# Patient Record
Sex: Male | Born: 1940 | Race: White | Hispanic: No | Smoking: Former smoker
Health system: Southern US, Community
[De-identification: ages and names within clinical notes are randomized; demographics above are authoritative.]

## PROBLEM LIST (undated history)

## (undated) DIAGNOSIS — E119 Type 2 diabetes mellitus without complications: Secondary | ICD-10-CM

## (undated) DIAGNOSIS — I10 Essential (primary) hypertension: Secondary | ICD-10-CM

## (undated) DIAGNOSIS — L570 Actinic keratosis: Secondary | ICD-10-CM

## (undated) HISTORY — PX: SKIN CANCER EXCISION: SHX779

## (undated) HISTORY — DX: Essential (primary) hypertension: I10

## (undated) HISTORY — DX: Actinic keratosis: L57.0

## (undated) HISTORY — DX: Type 2 diabetes mellitus without complications: E11.9

## (undated) HISTORY — PX: PACEMAKER PLACEMENT: SHX43

---

## 2005-04-02 ENCOUNTER — Ambulatory Visit: Payer: Self-pay | Admitting: Family Medicine

## 2005-04-08 ENCOUNTER — Ambulatory Visit: Payer: Self-pay | Admitting: Family Medicine

## 2008-03-10 ENCOUNTER — Emergency Department: Payer: Self-pay | Admitting: Emergency Medicine

## 2009-12-21 ENCOUNTER — Ambulatory Visit: Payer: Self-pay | Admitting: Orthopedic Surgery

## 2010-07-10 ENCOUNTER — Ambulatory Visit: Payer: Self-pay | Admitting: Cardiology

## 2010-07-17 ENCOUNTER — Ambulatory Visit: Payer: Self-pay | Admitting: Cardiology

## 2011-11-03 ENCOUNTER — Ambulatory Visit: Payer: Self-pay | Admitting: Unknown Physician Specialty

## 2011-11-05 LAB — PATHOLOGY REPORT

## 2012-02-16 ENCOUNTER — Ambulatory Visit: Payer: Self-pay | Admitting: Orthopedic Surgery

## 2012-09-29 ENCOUNTER — Ambulatory Visit: Payer: Self-pay | Admitting: Urology

## 2012-09-29 LAB — BASIC METABOLIC PANEL
Anion Gap: 4 — ABNORMAL LOW (ref 7–16)
BUN: 12 mg/dL (ref 7–18)
Calcium, Total: 9.5 mg/dL (ref 8.5–10.1)
Chloride: 105 mmol/L (ref 98–107)
Creatinine: 0.65 mg/dL (ref 0.60–1.30)
EGFR (African American): >60
Glucose: 118 mg/dL — ABNORMAL HIGH (ref 65–99)
Osmolality: 273 (ref 275–301)
Sodium: 136 mmol/L (ref 136–145)

## 2012-10-04 ENCOUNTER — Ambulatory Visit: Payer: Self-pay | Admitting: Urology

## 2013-01-06 ENCOUNTER — Encounter: Payer: Self-pay | Admitting: Podiatry

## 2013-01-07 ENCOUNTER — Encounter: Payer: Self-pay | Admitting: Podiatry

## 2013-01-07 ENCOUNTER — Ambulatory Visit (INDEPENDENT_AMBULATORY_CARE_PROVIDER_SITE_OTHER): Payer: Medicare Other | Admitting: Podiatry

## 2013-01-07 VITALS — BP 126/87 | HR 83 | Resp 16

## 2013-01-07 DIAGNOSIS — B351 Tinea unguium: Secondary | ICD-10-CM

## 2013-01-07 DIAGNOSIS — L259 Unspecified contact dermatitis, unspecified cause: Secondary | ICD-10-CM

## 2013-01-07 DIAGNOSIS — M79609 Pain in unspecified limb: Secondary | ICD-10-CM

## 2013-01-07 NOTE — Progress Notes (Signed)
Subjective:     Patient ID: LEWIN PELLOW, male   DOB: 1940/12/18, 72 y.o.   MRN: [id number]  HPI patient presents with nail disease of both feet with thickness and pain in the corners and also dry skin in the back of the feet that he wants to discuss and consider further treatment for   Review of Systems     Objective:   Physical Exam Neurovascular status unchanged with no change in health history. Nail disease with thickness 1-5 both feet that has improved with oral and topical treatment. 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 We discussed the heel condition and will continue utilizing Vaseline under occlusion and nystatin which was prescribed today

## 2013-01-07 NOTE — Progress Notes (Signed)
   Subjective:    Patient ID: Carl Chen, male    DOB: [birth year], 72 y.o.   MRN: [id number]  HPI Comments: i really dont know i guess he is going to look at them , cut toenails      Review of Systems     Objective:   Physical Exam        Assessment & Plan:

## 2013-04-08 ENCOUNTER — Ambulatory Visit: Payer: Medicare Other | Admitting: Podiatry

## 2013-05-06 ENCOUNTER — Ambulatory Visit: Payer: Self-pay | Admitting: Endocrinology

## 2013-07-01 DIAGNOSIS — E782 Mixed hyperlipidemia: Secondary | ICD-10-CM | POA: Insufficient documentation

## 2014-03-06 ENCOUNTER — Ambulatory Visit: Payer: Self-pay | Admitting: Unknown Physician Specialty

## 2014-03-13 DIAGNOSIS — R079 Chest pain, unspecified: Secondary | ICD-10-CM | POA: Insufficient documentation

## 2014-03-13 DIAGNOSIS — I5022 Chronic systolic (congestive) heart failure: Secondary | ICD-10-CM | POA: Insufficient documentation

## 2014-04-13 ENCOUNTER — Telehealth: Payer: Self-pay | Admitting: *Deleted

## 2014-04-13 MED ORDER — NYSTATIN-TRIAMCINOLONE 100000-0.1 UNIT/GM-% EX OINT: 1.0000 "application " | TOPICAL_OINTMENT | Freq: Two times a day (BID) | CUTANEOUS | Status: DC

## 2014-04-13 NOTE — Telephone Encounter (Signed)
Refill request for nystatin from [facility]. Ok to fill

## 2014-05-12 NOTE — H&P (Signed)
PATIENT NAME:  Carl Chen, Carl Chen MR#:  [id number] DATE OF BIRTH:  03/29/1940  DATE OF ADMISSION:  10/04/2012  CHIEF COMPLAINT: Difficulty urinating.   HISTORY OF PRESENT ILLNESS:  Mr. Droke is a 74 year old white male with a 3 to 4 year history of difficulty voiding. He also was found to have an elevated PSA of 7.85. Evaluation in the office included ultrasound-guided biopsy which revealed a 72 gram prostate with benign histology. Uroflow study was consistent with significant obstruction and a postvoid residual of 135 mL. 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 The patient also had a posterior bladder wall diverticulum and a heavily trabeculated bladder. The patient comes in now for Photovaporization of the prostate with green light laser.   ALLERGIES: PENICILLIN.   CURRENT MEDICATIONS: Included Flomax, metformin, glipizide, amlodipine, losartan, simvastatin and Tylenol.   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   SOCIAL HISTORY: The patient chews tobacco. He denied alcohol use.   FAMILY HISTORY: Remarkable for hypertension.   PAST AND CURRENT MEDICAL CONDITIONS:  1.  Diabetes.  2.  Hypertension.  3.  Hyperlipidemia.  4.  Status post pacemaker placement for uncertain reason.  5.  Chronic shoulder and back pain.   REVIEW OF SYSTEMS: The patient denied stroke, shortness of breath, chest pain or myocardial infarction.   PHYSICAL EXAMINATION: GENERAL: Thin, white male in no distress.  HEENT: Sclerae were clear.  NECK: Supple. No palpable cervical adenopathy.  LUNGS: Clear to auscultation.  CARDIOVASCULAR: Regular rhythm and rate without audible murmurs.  ABDOMEN: Soft, nontender abdomen.  GENITOURINARY: Uncircumcised testes smooth and nontender.  RECTAL: Greater than 50 grams smooth nontender prostate.  NEUROMUSCULAR: Alert and  oriented x3.   IMPRESSION:  1.  Benign prostatic hypertrophy with bladder outlet obstruction.  2.  Elevated PSA.   PLAN: Photovaporization of the prostate with green light laser.    ____________________________ Otelia Limes. Yves Dill, MD [email or web address]:dp D: 09/29/2012 13:11:00 ET T: 09/29/2012 13:33:54 ET JOB#: [id number]  cc: Otelia Limes. Yves Dill, MD, <Dictator> Royston Cowper MD ELECTRONICALLY SIGNED 09/29/2012 19:27

## 2014-05-12 NOTE — Op Note (Signed)
PATIENT NAME:  Carl Chen, Carl Chen MR#:  [id number] DATE OF BIRTH:  Apr 04, 1940  DATE OF PROCEDURE:  10/04/2012  PREOPERATIVE DIAGNOSIS: Benign prostatic hypertrophy with bladder outlet obstruction.   POSTOPERATIVE DIAGNOSIS: Benign prostatic hypertrophy with bladder outlet obstruction.   PROCEDURE PERFORMED: Photovaporization of the prostate with the GreenLight laser.   SURGEON: Maryan Puls, M.D.   ANESTHETIST: Rice  ANESTHESIA METHOD: General.   INDICATIONS: See the dictated history and physical. After informed consent, the patient requests the above procedures.   OPERATIVE SUMMARY: After adequate general anesthesia had been obtained, the patient was placed into dorsal lithotomy position and the perineum was prepped and draped in the usual fashion. The laser scope was coupled with the camera and then visually advanced into the bladder. The bladder was heavily trabeculated with cellules and multiple small diverticula present. The patient was noted to have trilobar BPH with intravesical growth of a large median lobe. At this point the GreenLight XPS laser fiber was introduced through the scope and initial power setting placed at 80 watts. Median lobe and bladder neck tissue was vaporized. Power was then increased to 120 watts and obstructive tissue from the bladder neck to the verumontanum was vaporized. Finally, the power was increased to 180 watts and remaining obstructive tissue vaporized. At this point the scope was removed. A 20 French silicone catheter was placed. The catheter was irrigated until clear. B and O suppositories was placed. The procedure was then terminated and the patient was transferred to the recovery room in stable condition.      ____________________________ Otelia Limes. Yves Dill, MD [email or web address]:sg D: 10/04/2012 09:54:00 ET T: 10/04/2012 10:59:27 ET JOB#: [id number]  cc: Otelia Limes. Yves Dill, MD, <Dictator> Royston Cowper MD ELECTRONICALLY SIGNED 10/07/2012 10:12

## 2014-07-04 DIAGNOSIS — I10 Essential (primary) hypertension: Secondary | ICD-10-CM | POA: Insufficient documentation

## 2014-07-06 ENCOUNTER — Other Ambulatory Visit: Payer: Self-pay | Admitting: Unknown Physician Specialty

## 2014-07-06 DIAGNOSIS — M75102 Unspecified rotator cuff tear or rupture of left shoulder, not specified as traumatic: Secondary | ICD-10-CM

## 2014-07-11 ENCOUNTER — Ambulatory Visit
Admission: RE | Admit: 2014-07-11 | Discharge: 2014-07-11 | Disposition: A | Discharge status: Home / Self Care | Payer: Medicare Other | Source: Ambulatory Visit | Attending: Unknown Physician Specialty | Admitting: Unknown Physician Specialty

## 2014-07-11 DIAGNOSIS — M75102 Unspecified rotator cuff tear or rupture of left shoulder, not specified as traumatic: Secondary | ICD-10-CM

## 2014-07-11 DIAGNOSIS — M25512 Pain in left shoulder: Secondary | ICD-10-CM | POA: Insufficient documentation

## 2014-07-11 MED ORDER — IOHEXOL 300 MG/ML  SOLN: 10.0000 mL | Freq: Once | INTRAMUSCULAR | Status: AC | PRN

## 2015-07-26 ENCOUNTER — Telehealth: Payer: Self-pay | Admitting: *Deleted

## 2015-07-26 NOTE — Telephone Encounter (Signed)
[facility] sent a request for a refill on Nystatin 100000/gm topical cream, apply to affected areas twice daily.  Pharmacist stated although it has been a while, he had some left and it has helped his foot.  Response was sent that prescription was denied.  Patient needs to make an appointment to be evaluated.  He has not been seen since December 2014.

## 2015-12-28 ENCOUNTER — Other Ambulatory Visit: Payer: Self-pay | Admitting: Adult Health

## 2016-01-09 ENCOUNTER — Other Ambulatory Visit: Payer: Self-pay | Admitting: Adult Health

## 2016-01-09 DIAGNOSIS — M7989 Other specified soft tissue disorders: Secondary | ICD-10-CM

## 2016-02-08 ENCOUNTER — Encounter (INDEPENDENT_AMBULATORY_CARE_PROVIDER_SITE_OTHER): Payer: Self-pay

## 2016-02-08 ENCOUNTER — Other Ambulatory Visit (INDEPENDENT_AMBULATORY_CARE_PROVIDER_SITE_OTHER): Payer: Medicare Other

## 2016-02-08 ENCOUNTER — Other Ambulatory Visit: Payer: Self-pay | Admitting: Adult Health

## 2016-02-08 DIAGNOSIS — M7989 Other specified soft tissue disorders: Secondary | ICD-10-CM

## 2016-02-21 ENCOUNTER — Other Ambulatory Visit (INDEPENDENT_AMBULATORY_CARE_PROVIDER_SITE_OTHER): Payer: Medicare Other

## 2016-09-21 IMAGING — CT CT SHOULDER*L* W/CM
2 of 4 series · 11 of 28 positions shown, 14 images · non-contrast
Comparison: 07/11/2014

CLINICAL DATA: Left shoulder pain

EXAM:
CT ARTHROGRAPHY OF THE left shoulder
TECHNIQUE: Multidetector CT imaging was performed following the standard
protocol after injection of dilute contrast into the joint.

[Series 3: mpr · axial · 0.33mm/px · z∈[-296,-216]mm · 6 of 141 slices shown, 8 images]
[im 21/141  soft-tissue]
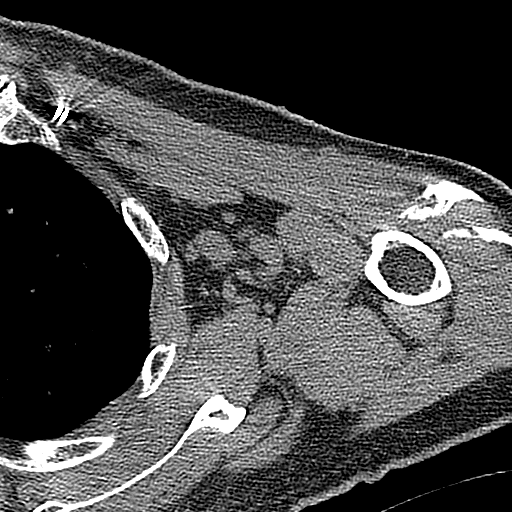
[im 21/141  bone]
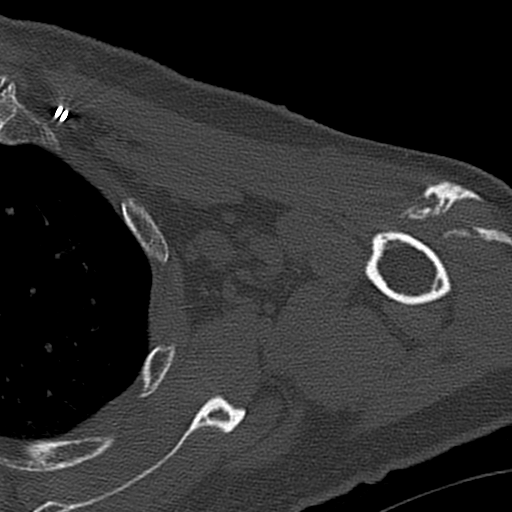
[im 41/141  bone]
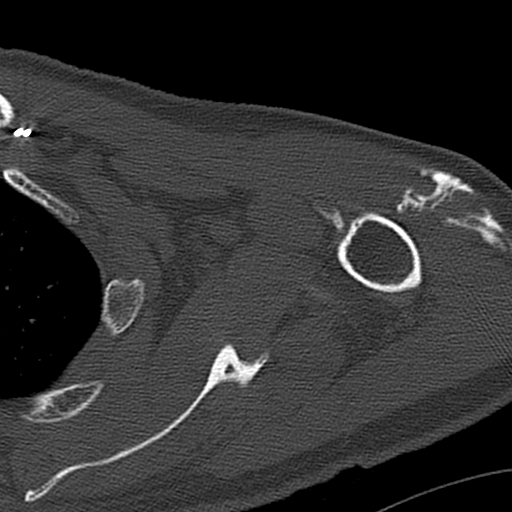
[im 61/141  bone]
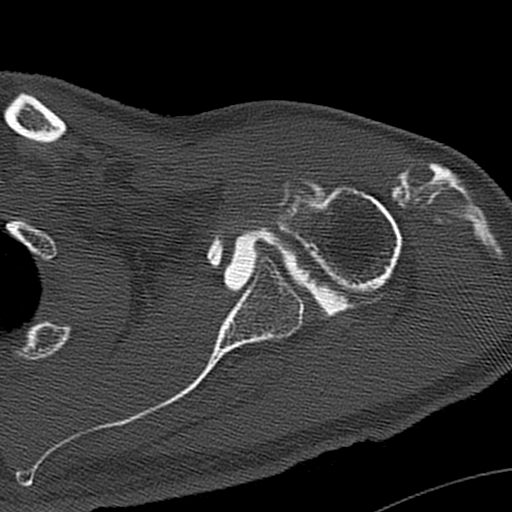
[im 81/141  bone]
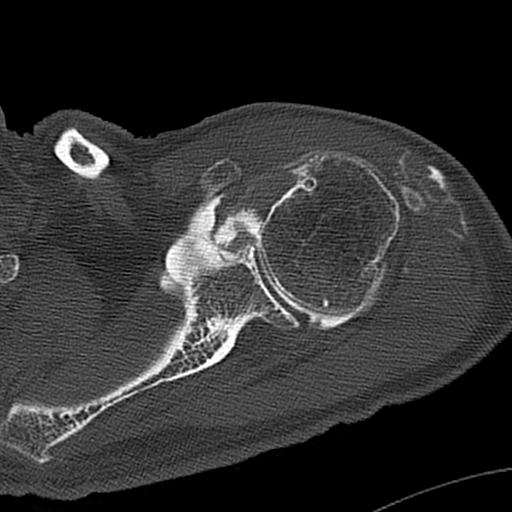
[im 101/141  soft-tissue]
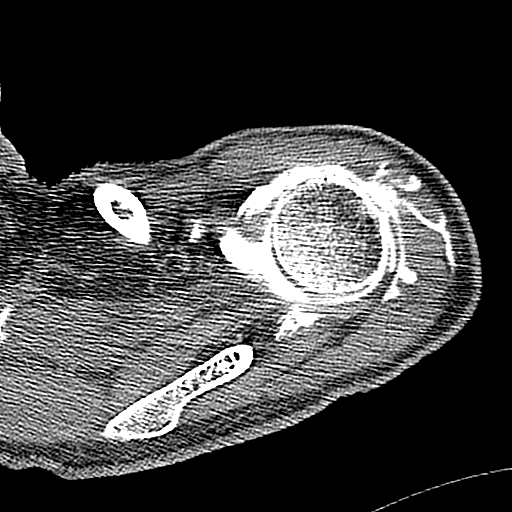
[im 101/141  bone]
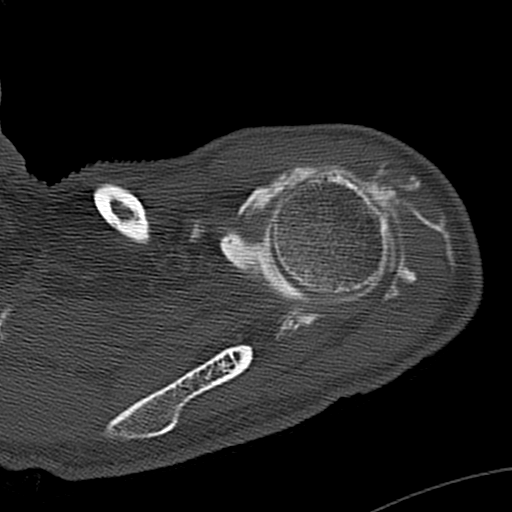
[im 121/141  bone]
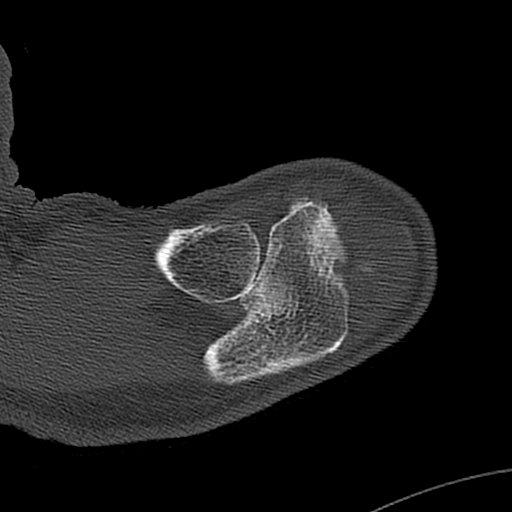

[Series 603: sag bone · sagittal · 0.33mm/px · 5 of 56 slices shown, 6 images]
[im 19/56  bone]
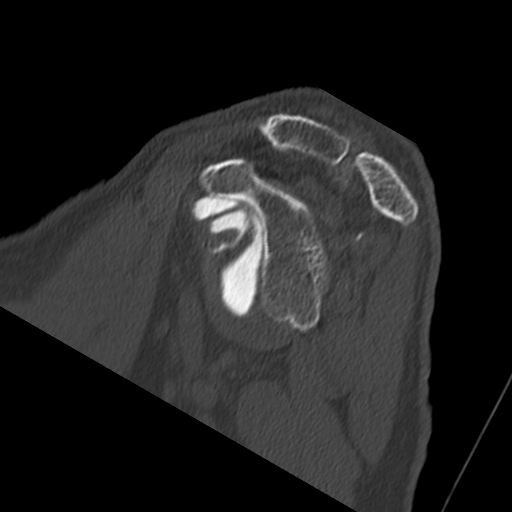
[im 23/56  bone]
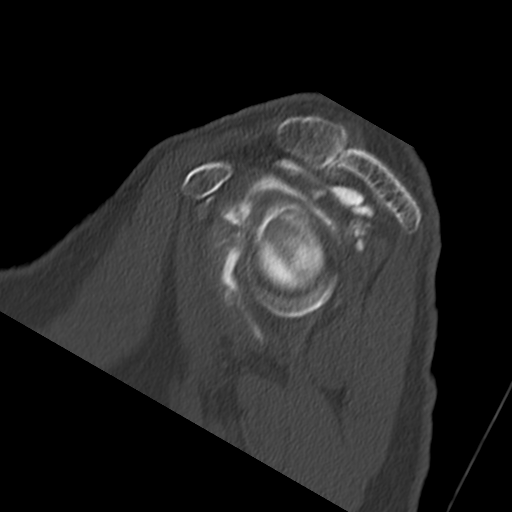
[im 28/56  soft-tissue]
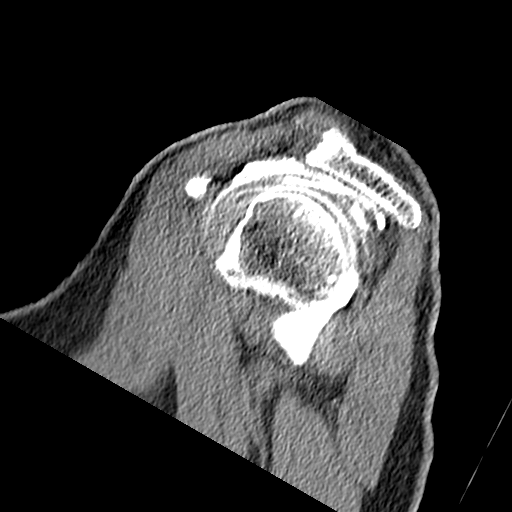
[im 28/56  bone]
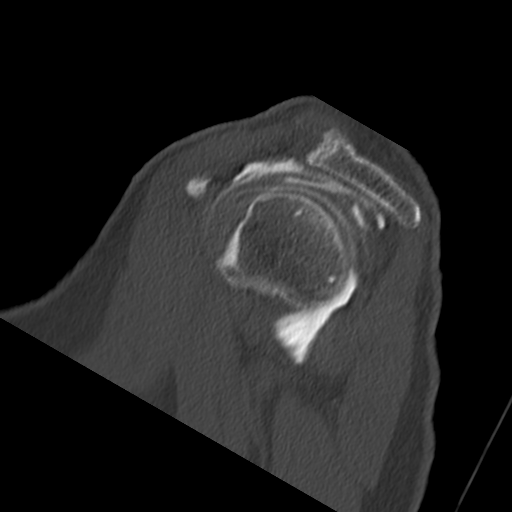
[im 33/56  bone]
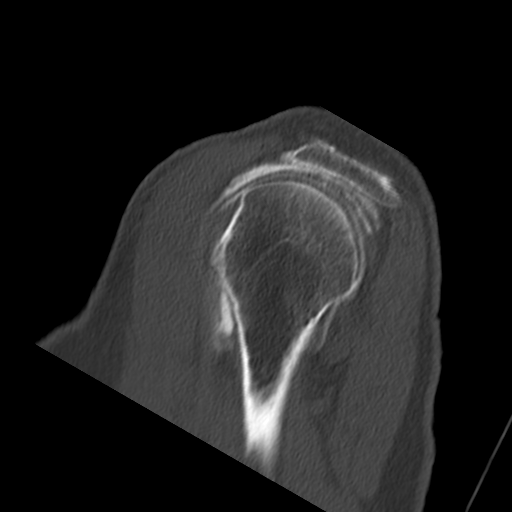
[im 37/56  bone]
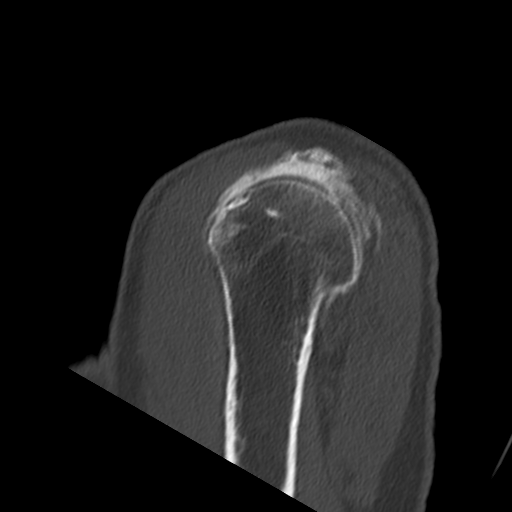

[11 of 28 positions shown; findings below may reference images not displayed]

FINDINGS: Rotator cuff: Full-thickness full width rupture of the
supraspinatus, with the pointed in irregular and of the
supraspinatus tendon retracted about 2.8 cm. There is some
delamination in the supraspinatus tendon as well, with the articular
side of the supraspinatus further retracted and the bursal side.
Currently there is only minimal fatty atrophy in the supraspinatus
tendon.

Full-thickness partial width tearing of the infraspinatus tendon
especially superiorly, with intrasubstance fissuring containing
contrast medium, and prominent thinning of the infraspinatus tendon.
Mild infraspinatus muscular atrophy.

Full-thickness partial width tearing of portions of the
subscapularis although without significant muscular atrophy.

No tear involving the articular surface of the teres minor observed.

Mild longitudinal tearing in the long head of the biceps is
appreciable in the bicipital groove.

Moderate degenerative AC joint arthropathy noted with inferior
spurring and type 2 subacromial morphology.

There is moderate chondral thinning along the humeral head with
relative preservation of the articular cartilage of the glenoid.

Contrast medium tracks into the deltoid muscle laterally. This is
unusual and was not at the site of injection, and raises the
possibility of a lateral deltoid muscle tear.

There is relatively nondisplaced SLAP tear shown on images 29-23 of
series 602.
IMPRESSION: 1. Full-thickness full width rupture of the supraspinatus, variable
amount of retraction but with the least retracted portion pulled
back 2.8 cm. Tendon delamination.
2. Full-thickness partial width tearing of the infraspinatus and
subscapularis tendons.
3. Mild atrophy in the supraspinatus and infraspinatus muscles.
4. Contrast tracks distally in the lateral deltoid muscle, not at
the site of injection, suspicious for lateral deltoid muscular tear.
5. Mild longitudinal tearing in the long head of the biceps in the
bicipital groove.
6. Nondisplaced SLAP tear.
7. Moderate degenerative AC joint arthropathy. Moderate chondral
thinning along the humeral head.

## 2016-09-21 IMAGING — RF DG ARTHROGRAM SHOULDER*L*
4 series · 4 of 4 positions shown · IV contrast (omnipaque)
Comparison: None.

CLINICAL DATA: Left shoulder pain. Nontraumatic tear left rotator
cuff

EXAM:
ARTHROGRAM OF THE LEFT SHOULDER
FLUOROSCOPY TIME:  Radiation Exposure Index (as provided by the
fluoroscopic device):
If the device does not provide the exposure index:
Fluoroscopy Time (in minutes and seconds):  1 minutes 10 seconds
Number of Acquired Images:
TECHNIQUE: I obtained informed written consent from the patient including
possible complications of infection, bleeding, and allergic
reaction. The patient was not aware that an injection would be
performed today however agreed to the procedure after discussion.
The left shoulder was prepped with chlorhexidine. 2% lidocaine was
used for local anesthesia. 22 gauge spinal needle was placed into
the shoulder joint using fluoroscopic guidance. 10 mL of a mixture
of 5 mL of Omnipaque 300 IV and 5 mL of 1% lidocaine was injected
into the shoulder joint.

[Series 1: fluoro_arthrogram_singleshot_bw · 0.19mm/px · 1 of 1 slices shown (1 of 3)]
[im 1/1]
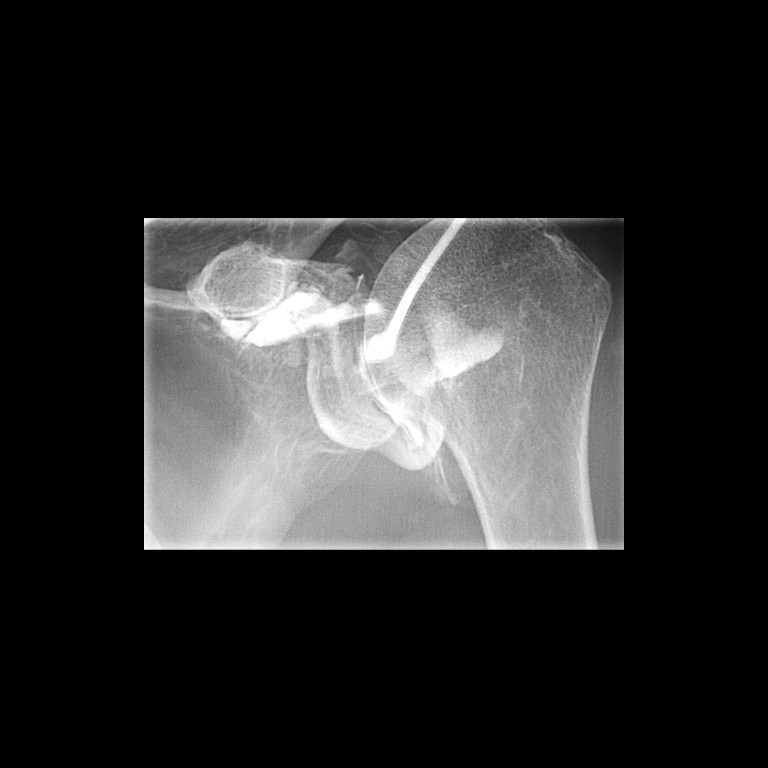

[Series 2: fluoro_arthrogram_singleshot_bw · 0.19mm/px · 1 of 1 slices shown (2 of 3)]
[im 1/1]
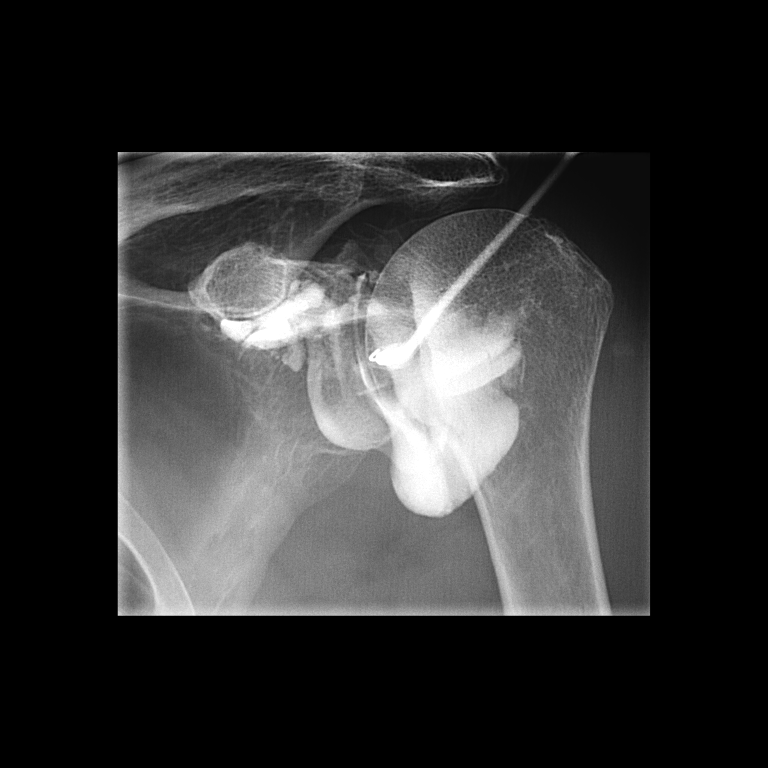

[Series 3: fluoro_arthrogram_singleshot_bw · 0.19mm/px · 1 of 1 slices shown (3 of 3)]
[im 1/1]
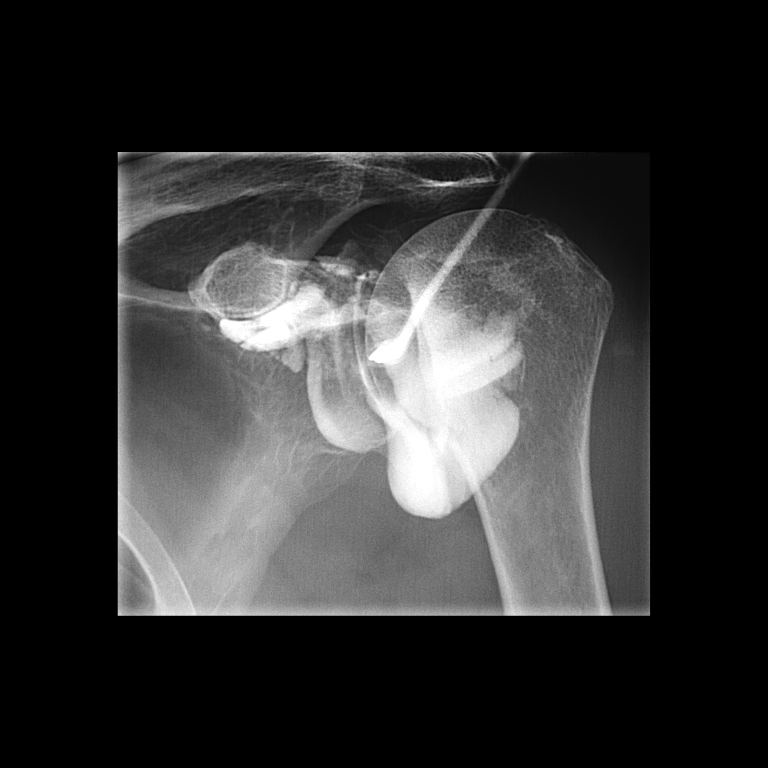

[Series 4: cp_standard · 0.19mm/px · 1 of 1 slices shown]
[im 1/1]
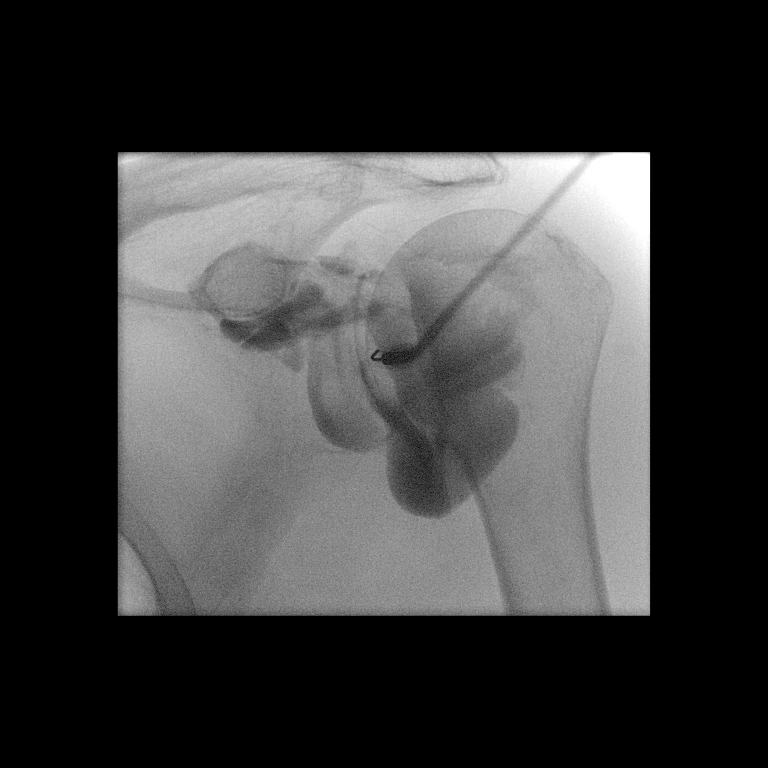

[4 of 4 positions shown; findings below may reference images not displayed]

FINDINGS: Satisfactory left shoulder injection. No rotator cuff tear
identified. Shoulder joint appears normal.
IMPRESSION: Successful left shoulder injection for CT evaluation.

## 2017-05-01 DIAGNOSIS — I420 Dilated cardiomyopathy: Secondary | ICD-10-CM | POA: Insufficient documentation

## 2017-05-19 DIAGNOSIS — Z95810 Presence of automatic (implantable) cardiac defibrillator: Secondary | ICD-10-CM | POA: Insufficient documentation

## 2017-12-14 ENCOUNTER — Encounter: Payer: Medicare Other | Attending: Cardiology | Admitting: *Deleted

## 2017-12-14 ENCOUNTER — Encounter: Payer: Self-pay | Admitting: *Deleted

## 2017-12-14 VITALS — Ht 71.1 in | Wt 158.1 lb

## 2017-12-14 DIAGNOSIS — I5022 Chronic systolic (congestive) heart failure: Secondary | ICD-10-CM | POA: Diagnosis present

## 2017-12-14 DIAGNOSIS — Z720 Tobacco use: Secondary | ICD-10-CM | POA: Diagnosis not present

## 2017-12-14 DIAGNOSIS — I10 Essential (primary) hypertension: Secondary | ICD-10-CM | POA: Diagnosis not present

## 2017-12-14 DIAGNOSIS — E119 Type 2 diabetes mellitus without complications: Secondary | ICD-10-CM | POA: Insufficient documentation

## 2017-12-14 DIAGNOSIS — Z79899 Other long term (current) drug therapy: Secondary | ICD-10-CM | POA: Diagnosis not present

## 2017-12-14 DIAGNOSIS — Z794 Long term (current) use of insulin: Secondary | ICD-10-CM | POA: Insufficient documentation

## 2017-12-14 NOTE — Progress Notes (Signed)
Cardiac Individual Treatment Plan  Patient Details  Name: Carl Chen MRN: [id number] Date of Birth: September 23, 1940 Referring Provider:     Cardiac Rehab from 12/14/2017 in [facility] Cardiac and Pulmonary Rehab  Referring Provider  [name] MD      Initial Encounter Date:    Cardiac Rehab from 12/14/2017 in [facility] Cardiac and Pulmonary Rehab  Date  12/14/17      Visit Diagnosis: Heart failure, chronic systolic (HCC)  Patient's Home Medications on Admission:  Current Outpatient Medications:  .  amLODipine (NORVASC) 5 MG tablet, , Disp: , Rfl:  .  Cinnamon 500 MG capsule, Take by mouth., Disp: , Rfl:  .  ENTRESTO 24-26 MG, , Disp: , Rfl:  .  glipiZIDE (GLUCOTROL XL) 5 MG 24 hr tablet, Take 5 mg by mouth daily with breakfast., Disp: , Rfl:  .  Insulin Isophane & Regular Human (HUMULIN 70/30 KWIKPEN) (70-30) 100 UNIT/ML PEN, Inject into the skin., Disp: , Rfl:  .  Investigational - Study Medication, Take 35 mg by mouth daily. Take o each by mouth Daily before dinner 35 mg empaglifozin QQI/WL79 mg placebo. Study is being conducted by [employer] ,[place], Disp: , Rfl:  .  magnesium oxide (MAG-OX) 400 MG tablet, Take by mouth., Disp: , Rfl:  .  metFORMIN (GLUCOPHAGE) 1000 MG tablet, Take 1,000 mg by mouth 2 (two) times daily with a meal., Disp: , Rfl:  .  metoprolol succinate (TOPROL-XL) 25 MG 24 hr tablet, Take by mouth., Disp: , Rfl:  .  nystatin-triamcinolone ointment (MYCOLOG), Apply 1 application topically 2 (two) times daily., Disp: 30 g, Rfl: 0 .  simvastatin (ZOCOR) 20 MG tablet, Take 20 mg by mouth daily., Disp: , Rfl:  .  Urea (URAMAXIN EX), Apply topically., Disp: , Rfl:  .  vitamin B-12 (CYANOCOBALAMIN) 500 MCG tablet, Take by mouth., Disp: , Rfl:  .  amLODipine-olmesartan (AZOR) 5-20 MG per tablet, Take 1 tablet by mouth daily., Disp: , Rfl:  .  losartan (COZAAR) 50 MG tablet, Take 50 mg by mouth daily., Disp: , Rfl:   Past Medical History: Past Medical History:   Diagnosis Date  . Diabetes ([place])   . Hypertension     Tobacco Use: Social History   Tobacco Use  Smoking Status Current Every Day Smoker  . Packs/day: 3.00  . Years: 60.00  . Pack years: 180.00  Smokeless Tobacco Current User  . Types: Chew  Tobacco Comment   Not ready to Quit tobacco pouch use    Labs: Recent Review Flowsheet Data    There is no flowsheet data to display.       Exercise Target Goals: Exercise Program Goal: Individual exercise prescription set using results from initial 6 min walk test and THRR while considering  patient's activity barriers and safety.   Exercise Prescription Goal: Initial exercise prescription builds to 30-45 minutes a day of aerobic activity, 2-3 days per week.  Home exercise guidelines will be given to patient during program as part of exercise prescription that the participant will acknowledge.  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 Has had cortisone shots in the past   Cardiac Risk Stratification  High       6 Minute Walk: 6 Minute Walk    Row Name 12/14/17  1411         6 Minute Walk   Phase  Initial     Distance  1360 feet     Walk Time  6 minutes     # of Rest Breaks  0     MPH  2.58     METS  3.05     RPE  9     VO2 Peak  10.67     Symptoms  No     Resting HR  76 bpm     Resting BP  126/74     Resting Oxygen Saturation   97 %     Exercise Oxygen Saturation  during 6 min walk  96 %     Max Ex. HR  80 bpm     Max Ex. BP  146/70     2 Minute Post BP  126/62        Oxygen Initial Assessment:   Oxygen Re-Evaluation:   Oxygen Discharge (Final Oxygen Re-Evaluation):   Initial Exercise Prescription: Initial Exercise Prescription - 12/14/17 1400      Date of  Initial Exercise RX and Referring Provider   Date  12/14/17    Referring Provider  [name] MD      Treadmill   MPH  2.5    Grade  0.5    Minutes  15    METs  3.09      NuStep   Level  3    SPM  80    Minutes  15    METs  3      REL-XR   Level  2    Speed  50    Minutes  15    METs  3      Prescription Details   Frequency (times per week)  2    Duration  Progress to 30 minutes of continuous aerobic without signs/symptoms of physical distress      Intensity   THRR 40-80% of Max Heartrate  104-131    Ratings of Perceived Exertion  11-13    Perceived Dyspnea  0-4      Progression   Progression  Continue to progress workloads to maintain intensity without signs/symptoms of physical distress.      Resistance Training   Training Prescription  Yes    Weight  4 lbs    Reps  10-15       Perform Capillary Blood Glucose checks as needed.  Exercise Prescription Changes: Exercise Prescription Changes    Row Name 12/14/17 1300             Response to Exercise   Blood Pressure (Admit)  126/74       Blood Pressure (Exercise)  146/70       Blood Pressure (Exit)  126/62       Heart Rate (Admit)  76 bpm       Heart Rate (Exercise)  80 bpm       Heart Rate (Exit)  75 bpm       Oxygen Saturation (Admit)  97 %       Oxygen Saturation (Exercise)  96 %       Rating of Perceived Exertion (Exercise)  9       Symptoms  none       Comments  walk test results          Exercise Comments:   Exercise Goals and Review: Exercise Goals    Row Name 12/14/17 1414  Exercise Goals   Increase Physical Activity  Yes       Intervention  Provide advice, education, support and counseling about physical activity/exercise needs.;Develop an individualized exercise prescription for aerobic and resistive training based on initial evaluation findings, risk stratification, comorbidities and participant's personal goals.       Expected Outcomes  Short Term: Attend rehab on  a regular basis to increase amount of physical activity.;Long Term: Exercising regularly at least 3-5 days a week.;Long Term: Add in home exercise to make exercise part of routine and to increase amount of physical activity.       Increase Strength and Stamina  Yes       Intervention  Provide advice, education, support and counseling about physical activity/exercise needs.;Develop an individualized exercise prescription for aerobic and resistive training based on initial evaluation findings, risk stratification, comorbidities and participant's personal goals.       Expected Outcomes  Short Term: Increase workloads from initial exercise prescription for resistance, speed, and METs.;Short Term: Perform resistance training exercises routinely during rehab and add in resistance training at home;Long Term: Improve cardiorespiratory fitness, muscular endurance and strength as measured by increased METs and functional capacity (6MWT)       Able to understand and use rate of perceived exertion (RPE) scale  Yes       Intervention  Provide education and explanation on how to use RPE scale       Expected Outcomes  Short Term: Able to use RPE daily in rehab to express subjective intensity level;Long Term:  Able to use RPE to guide intensity level when exercising independently       Knowledge and understanding of Target Heart Rate Range (THRR)  Yes       Intervention  Provide education and explanation of THRR including how the numbers were predicted and where they are located for reference       Expected Outcomes  Short Term: Able to state/look up THRR;Short Term: Able to use daily as guideline for intensity in rehab;Long Term: Able to use THRR to govern intensity when exercising independently       Able to check pulse independently  Yes       Intervention  Provide education and demonstration on how to check pulse in carotid and radial arteries.;Review the importance of being able to check your own pulse for safety  during independent exercise       Expected Outcomes  Short Term: Able to explain why pulse checking is important during independent exercise;Long Term: Able to check pulse independently and accurately       Understanding of Exercise Prescription  Yes       Intervention  Provide education, explanation, and written materials on patient's individual exercise prescription       Expected Outcomes  Short Term: Able to explain program exercise prescription;Long Term: Able to explain home exercise prescription to exercise independently          Exercise Goals Re-Evaluation :   Discharge Exercise Prescription (Final Exercise Prescription Changes): Exercise Prescription Changes - 12/14/17 1300      Response to Exercise   Blood Pressure (Admit)  126/74    Blood Pressure (Exercise)  146/70    Blood Pressure (Exit)  126/62    Heart Rate (Admit)  76 bpm    Heart Rate (Exercise)  80 bpm    Heart Rate (Exit)  75 bpm    Oxygen Saturation (Admit)  97 %    Oxygen Saturation (Exercise)  96 %    Rating of Perceived Exertion (Exercise)  9    Symptoms  none    Comments  walk test results       Nutrition:  Target Goals: Understanding of nutrition guidelines, daily intake of sodium <1575m, cholesterol <2028m calories 30% from fat and 7% or less from saturated fats, daily to have 5 or more servings of fruits and vegetables.  Biometrics: Pre Biometrics - 12/14/17 1415      Pre Biometrics   Height  5' 11.1" (1.806 m)    Weight  158 lb 1.6 oz (71.7 kg)    Waist Circumference  32 inches    Hip Circumference  38 inches    Waist to Hip Ratio  0.84 %    BMI (Calculated)  21.99    Single Leg Stand  15.3 seconds        Nutrition Therapy Plan and Nutrition Goals: Nutrition Therapy & Goals - 12/14/17 1355      Intervention Plan   Intervention  Prescribe, educate and counsel regarding individualized specific dietary modifications aiming towards targeted core components such as weight, hypertension,  lipid management, diabetes, heart failure and other comorbidities.    Expected Outcomes  Short Term Goal: Understand basic principles of dietary content, such as calories, fat, sodium, cholesterol and nutrients.;Short Term Goal: A plan has been developed with personal nutrition goals set during dietitian appointment.;Long Term Goal: Adherence to prescribed nutrition plan.       Nutrition Assessments: Nutrition Assessments - 12/14/17 1356      MEDFICTS Scores   Pre Score  6       Nutrition Goals Re-Evaluation:   Nutrition Goals Discharge (Final Nutrition Goals Re-Evaluation):   Psychosocial: Target Goals: Acknowledge presence or absence of significant depression and/or stress, maximize coping skills, provide positive support system. Participant is able to verbalize types and ability to use techniques and skills needed for reducing stress and depression.   Initial Review & Psychosocial Screening: Initial Psych Review & Screening - 12/14/17 1353      Initial Review   Current issues with  None Identified      Family Dynamics   Good Support System?  Yes   daughter-Michelle     Barriers   Psychosocial barriers to participate in program  There are no identifiable barriers or psychosocial needs.;The patient should benefit from training in stress management and relaxation.      Screening Interventions   Interventions  Encouraged to exercise;To provide support and resources with identified psychosocial needs;Provide feedback about the scores to participant    Expected Outcomes  Short Term goal: Utilizing psychosocial counselor, staff and physician to assist with identification of specific Stressors or current issues interfering with healing process. Setting desired goal for each stressor or current issue identified.;Long Term Goal: Stressors or current issues are controlled or eliminated.;Short Term goal: Identification and review with participant of any Quality of Life or Depression  concerns found by scoring the questionnaire.;Long Term goal: The participant improves quality of Life and PHQ9 Scores as seen by post scores and/or verbalization of changes       Quality of Life Scores:  Quality of Life - 12/14/17 1355      Quality of Life   Select  Quality of Life      Quality of Life Scores   Health/Function Pre  25.61 %    Socioeconomic Pre  23.57 %    Psych/Spiritual Pre  23.57 %    Family Pre  25.63 %  GLOBAL Pre  24.72 %      Scores of 19 and below usually indicate a poorer quality of life in these areas.  A difference of  2-3 points is a clinically meaningful difference.  A difference of 2-3 points in the total score of the Quality of Life Index has been associated with significant improvement in overall quality of life, self-image, physical symptoms, and general health in studies assessing change in quality of life.  PHQ-9: Recent Review Flowsheet Data    Depression screen [facility] 2/9 12/14/2017   Decreased Interest 0   Down, Depressed, Hopeless 0   PHQ - 2 Score 0   Altered sleeping 0   Tired, decreased energy 0   Change in appetite 0   Feeling bad or failure about yourself  0   Trouble concentrating 0   Moving slowly or fidgety/restless 0   Suicidal thoughts 0   PHQ-9 Score 0   Difficult doing work/chores Not difficult at all     Interpretation of Total Score  Total Score Depression Severity:  1-4 = Minimal depression, 5-9 = Mild depression, 10-14 = Moderate depression, 15-19 = Moderately severe depression, 20-27 = Severe depression   Psychosocial Evaluation and Intervention:   Psychosocial Re-Evaluation:   Psychosocial Discharge (Final Psychosocial Re-Evaluation):   Vocational Rehabilitation: Provide vocational rehab assistance to qualifying candidates.   Vocational Rehab Evaluation & Intervention: Vocational Rehab - 12/14/17 1357      Initial Vocational Rehab Evaluation & Intervention   Assessment shows need for Vocational  Rehabilitation  No       Education: Education Goals: Education classes will be provided on a variety of topics geared toward better understanding of heart health and risk factor modification. Participant will state understanding/return demonstration of topics presented as noted by education test scores.  Learning Barriers/Preferences: Learning Barriers/Preferences - 12/14/17 1356      Learning Barriers/Preferences   Learning Barriers  None    Learning Preferences  None       Education Topics:  AED/CPR: - Group verbal and written instruction with the use of models to demonstrate the basic use of the AED with the basic ABC's of resuscitation.   General Nutrition Guidelines/Fats and Fiber: -Group instruction provided by verbal, written material, models and posters to present the general guidelines for heart healthy nutrition. Gives an explanation and review of dietary fats and fiber.   Controlling Sodium/Reading Food Labels: -Group verbal and written material supporting the discussion of sodium use in heart healthy nutrition. Review and explanation with models, verbal and written materials for utilization of the food label.   Exercise Physiology & General Exercise Guidelines: - Group verbal and written instruction with models to review the exercise physiology of the cardiovascular system and associated critical values. Provides general exercise guidelines with specific guidelines to those with heart or lung disease.    Aerobic Exercise & Resistance Training: - Gives group verbal and written instruction on the various components of exercise. Focuses on aerobic and resistive training programs and the benefits of this training and how to safely progress through these programs..   Flexibility, Balance, Mind/Body Relaxation: Provides group verbal/written instruction on the benefits of flexibility and balance training, including mind/body exercise modes such as yoga, pilates and tai  chi.  Demonstration and skill practice provided.   Stress and Anxiety: - Provides group verbal and written instruction about the health risks of elevated stress and causes of high stress.  Discuss the correlation between heart/lung disease and anxiety and treatment  options. Review healthy ways to manage with stress and anxiety.   Depression: - Provides group verbal and written instruction on the correlation between heart/lung disease and depressed mood, treatment options, and the stigmas associated with seeking treatment.   Anatomy & Physiology of the Heart: - Group verbal and written instruction and models provide basic cardiac anatomy and physiology, with the coronary electrical and arterial systems. Review of Valvular disease and Heart Failure   Cardiac Procedures: - Group verbal and written instruction to review commonly prescribed medications for heart disease. Reviews the medication, class of the drug, and side effects. Includes the steps to properly store meds and maintain the prescription regimen. (beta blockers and nitrates)   Cardiac Medications I: - Group verbal and written instruction to review commonly prescribed medications for heart disease. Reviews the medication, class of the drug, and side effects. Includes the steps to properly store meds and maintain the prescription regimen.   Cardiac Medications II: -Group verbal and written instruction to review commonly prescribed medications for heart disease. Reviews the medication, class of the drug, and side effects. (all other drug classes)    Go Sex-Intimacy & Heart Disease, Get SMART - Goal Setting: - Group verbal and written instruction through game format to discuss heart disease and the return to sexual intimacy. Provides group verbal and written material to discuss and apply goal setting through the application of the S.M.A.R.T. Method.   Other Matters of the Heart: - Provides group verbal, written materials and  models to describe Stable Angina and Peripheral Artery. Includes description of the disease process and treatment options available to the cardiac patient.   Exercise & Equipment Safety: - Individual verbal instruction and demonstration of equipment use and safety with use of the equipment.   Cardiac Rehab from 12/14/2017 in [facility] Cardiac and Pulmonary Rehab  Date  12/14/17  Educator  [facility]  Instruction Review Code  1- Verbalizes Understanding      Infection Prevention: - Provides verbal and written material to individual with discussion of infection control including proper hand washing and proper equipment cleaning during exercise session.   Falls Prevention: - Provides verbal and written material to individual with discussion of falls prevention and safety.   Cardiac Rehab from 12/14/2017 in [facility] Cardiac and Pulmonary Rehab  Date  12/14/17  Educator  [facility]  Instruction Review Code  1- Verbalizes Understanding      Diabetes: - Individual verbal and written instruction to review signs/symptoms of diabetes, desired ranges of glucose level fasting, after meals and with exercise. Acknowledge that pre and post exercise glucose checks will be done for 3 sessions at entry of program.   Cardiac Rehab from 12/14/2017 in [facility] Cardiac and Pulmonary Rehab  Date  12/14/17  Educator  [facility]  Instruction Review Code  1- Verbalizes Understanding      Know Your Numbers and Risk Factors: -Group verbal and written instruction about important numbers in your health.  Discussion of what are risk factors and how they play a role in the disease process.  Review of Cholesterol, Blood Pressure, Diabetes, and BMI and the role they play in your overall health.   Sleep Hygiene: -Provides group verbal and written instruction about how sleep can affect your health.  Define sleep hygiene, discuss sleep cycles and impact of sleep habits. Review good sleep hygiene tips.    Other: -Provides group and verbal  instruction on various topics (see comments)   Knowledge Questionnaire Score: Knowledge Questionnaire Score - 12/14/17 1356  Knowledge Questionnaire Score   Pre Score  22/26   Reviewed correct responses with Carl Chen today. He verbalized understanding of the answers and had no further questions today.      Core Components/Risk Factors/Patient Goals at Admission: Personal Goals and Risk Factors at Admission - 12/14/17 1357      Core Components/Risk Factors/Patient Goals on Admission    Weight Management  Yes;Weight Gain    Intervention  Weight Management: Develop a combined nutrition and exercise program designed to reach desired caloric intake, while maintaining appropriate intake of nutrient and fiber, sodium and fats, and appropriate energy expenditure required for the weight goal.;Weight Management: Provide education and appropriate resources to help participant work on and attain dietary goals.    Admit Weight  158 lb 1.6 oz (71.7 kg)    Goal Weight: Short Term  160 lb (72.6 kg)    Goal Weight: Long Term  165 lb (74.8 kg)    Expected Outcomes  Short Term: Continue to assess and modify interventions until short term weight is achieved;Long Term: Adherence to nutrition and physical activity/exercise program aimed toward attainment of established weight goal;Weight Gain: Understanding of general recommendations for a high calorie, high protein meal plan that promotes weight gain by distributing calorie intake throughout the day with the consumption for 4-5 meals, snacks, and/or supplements   Carl Chen has lost about 20 lbs and his doctor has asked him to gain some weight.   Tobacco Cessation  Yes   Reviewed steps toward quitting with Carl Chen today. He does not state that he is ready to quit.    Number of packs per day  uses tobacco pouches about 3  a day    Intervention  Assist the participant in steps to quit. Provide individualized education and counseling about committing to Tobacco  Cessation, relapse prevention, and pharmacological support that can be provided by physician.;Advice worker, assist with locating and accessing local/national Quit Smoking programs, and support quit date choice.    Expected Outcomes  Short Term: Will demonstrate readiness to quit, by selecting a quit date.;Short Term: Will quit all tobacco product use, adhering to prevention of relapse plan.;Long Term: Complete abstinence from all tobacco products for at least 12 months from quit date.    Diabetes  Yes    Intervention  Provide education about signs/symptoms and action to take for hypo/hyperglycemia.;Provide education about proper nutrition, including hydration, and aerobic/resistive exercise prescription along with prescribed medications to achieve blood glucose in normal ranges: Fasting glucose 65-99 mg/dL    Expected Outcomes  Short Term: Participant verbalizes understanding of the signs/symptoms and immediate care of hyper/hypoglycemia, proper foot care and importance of medication, aerobic/resistive exercise and nutrition plan for blood glucose control.;Long Term: Attainment of HbA1C < 7%.    Heart Failure  Yes   Carl Chen does not have scales at home.  Will check to see if we can get him some scales.    Intervention  Provide a combined exercise and nutrition program that is supplemented with education, support and counseling about heart failure. Directed toward relieving symptoms such as shortness of breath, decreased exercise tolerance, and extremity edema.    Expected Outcomes  Improve functional capacity of life;Short term: Attendance in program 2-3 days a week with increased exercise capacity. Reported lower sodium intake. Reported increased fruit and vegetable intake. Reports medication compliance.;Short term: Daily weights obtained and reported for increase. Utilizing diuretic protocols set by physician.;Long term: Adoption of self-care skills and reduction of barriers for early signs  and symptoms recognition and intervention leading to self-care maintenance.    Lipids  Yes    Intervention  Provide education and support for participant on nutrition & aerobic/resistive exercise along with prescribed medications to achieve LDL <21m, HDL >42m    Expected Outcomes  Short Term: Participant states understanding of desired cholesterol values and is compliant with medications prescribed. Participant is following exercise prescription and nutrition guidelines.;Long Term: Cholesterol controlled with medications as prescribed, with individualized exercise RX and with personalized nutrition plan. Value goals: LDL < 7065mHDL > 40 mg.       Core Components/Risk Factors/Patient Goals Review:    Core Components/Risk Factors/Patient Goals at Discharge (Final Review):    ITP Comments: ITP Comments    Row Name 12/14/17 1337           ITP Comments  Medical evaluation completed today. ITP sent to Dr M MLoleta Chancer review,changes as needed and signature. [facility]          Comments: Initial ITP

## 2017-12-14 NOTE — Patient Instructions (Signed)
Patient Instructions  Patient Details  Name: Carl Chen MRN: [id number] Date of Birth: [birth year] Referring Provider:  Tylene Fantasia, MD  Below are your personal goals for exercise, nutrition, and risk factors. Our goal is to help you stay on track towards obtaining and maintaining these goals. We will be discussing your progress on these goals with you throughout the program.  Initial Exercise Prescription: Initial Exercise Prescription - 12/14/17 1400      Date of Initial Exercise RX and Referring Provider   Date  12/14/17    Referring Provider  [name] MD      Treadmill   MPH  2.5    Grade  0.5    Minutes  15    METs  3.09      NuStep   Level  3    SPM  80    Minutes  15    METs  3      REL-XR   Level  2    Speed  50    Minutes  15    METs  3      Prescription Details   Frequency (times per week)  2    Duration  Progress to 30 minutes of continuous aerobic without signs/symptoms of physical distress      Intensity   THRR 40-80% of Max Heartrate  104-131    Ratings of Perceived Exertion  11-13    Perceived Dyspnea  0-4      Progression   Progression  Continue to progress workloads to maintain intensity without signs/symptoms of physical distress.      Resistance Training   Training Prescription  Yes    Weight  4 lbs    Reps  10-15       Exercise Goals: Frequency: Be able to perform aerobic exercise two to three times per week in program working toward 2-5 days per week of home exercise.  Intensity: Work with a perceived exertion of 11 (fairly light) - 15 (hard) while following your exercise prescription.  We will make changes to your prescription with you as you progress through the program.   Duration: Be able to do 30 to 45 minutes of continuous aerobic exercise in addition to a 5 minute warm-up and a 5 minute cool-down routine.   Nutrition Goals: Your personal nutrition goals will be established when you do your nutrition analysis with  the dietician.  The following are general nutrition guidelines to follow: Cholesterol < 200mg /day Sodium < 1500mg /day Fiber: Men over 50 yrs - 30 grams per day  Personal Goals: Personal Goals and Risk Factors at Admission - 12/14/17 1357      Core Components/Risk Factors/Patient Goals on Admission    Weight Management  Yes;Weight Gain    Intervention  Weight Management: Develop a combined nutrition and exercise program designed to reach desired caloric intake, while maintaining appropriate intake of nutrient and fiber, sodium and fats, and appropriate energy expenditure required for the weight goal.;Weight Management: Provide education and appropriate resources to help participant work on and attain dietary goals.    Admit Weight  158 lb 1.6 oz (71.7 kg)    Goal Weight: Short Term  160 lb (72.6 kg)    Goal Weight: Long Term  165 lb (74.8 kg)    Expected Outcomes  Short Term: Continue to assess and modify interventions until short term weight is achieved;Long Term: Adherence to nutrition and physical activity/exercise program aimed toward attainment of established weight goal;Weight Gain: Understanding  of general recommendations for a high calorie, high protein meal plan that promotes weight gain by distributing calorie intake throughout the day with the consumption for 4-5 meals, snacks, and/or supplements   [name] has lost about 20 lbs and his doctor has asked him to gain some weight.   Tobacco Cessation  Yes   Reviewed steps toward quitting with [name] today. He does not state that he is ready to quit.    Number of packs per day  uses tobacco pouches about 3  a day    Intervention  Assist the participant in steps to quit. Provide individualized education and counseling about committing to Tobacco Cessation, relapse prevention, and pharmacological support that can be provided by physician.;Advice worker, assist with locating and accessing local/national Quit Smoking programs, and  support quit date choice.    Expected Outcomes  Short Term: Will demonstrate readiness to quit, by selecting a quit date.;Short Term: Will quit all tobacco product use, adhering to prevention of relapse plan.;Long Term: Complete abstinence from all tobacco products for at least 12 months from quit date.    Diabetes  Yes    Intervention  Provide education about signs/symptoms and action to take for hypo/hyperglycemia.;Provide education about proper nutrition, including hydration, and aerobic/resistive exercise prescription along with prescribed medications to achieve blood glucose in normal ranges: Fasting glucose 65-99 mg/dL    Expected Outcomes  Short Term: Participant verbalizes understanding of the signs/symptoms and immediate care of hyper/hypoglycemia, proper foot care and importance of medication, aerobic/resistive exercise and nutrition plan for blood glucose control.;Long Term: Attainment of HbA1C < 7%.    Heart Failure  Yes   [name] does not have scales at home.  Will check to see if we can get him some scales.    Intervention  Provide a combined exercise and nutrition program that is supplemented with education, support and counseling about heart failure. Directed toward relieving symptoms such as shortness of breath, decreased exercise tolerance, and extremity edema.    Expected Outcomes  Improve functional capacity of life;Short term: Attendance in program 2-3 days a week with increased exercise capacity. Reported lower sodium intake. Reported increased fruit and vegetable intake. Reports medication compliance.;Short term: Daily weights obtained and reported for increase. Utilizing diuretic protocols set by physician.;Long term: Adoption of self-care skills and reduction of barriers for early signs and symptoms recognition and intervention leading to self-care maintenance.    Lipids  Yes    Intervention  Provide education and support for participant on nutrition & aerobic/resistive exercise  along with prescribed medications to achieve LDL 70mg , HDL >40mg .    Expected Outcomes  Short Term: Participant states understanding of desired cholesterol values and is compliant with medications prescribed. Participant is following exercise prescription and nutrition guidelines.;Long Term: Cholesterol controlled with medications as prescribed, with individualized exercise RX and with personalized nutrition plan. Value goals: LDL < 70mg , HDL > 40 mg.       Tobacco Use Initial Evaluation: Social History   Tobacco Use  Smoking Status Current Every Day Smoker  . Packs/day: 3.00  . Years: 60.00  . Pack years: 180.00  Smokeless Tobacco Current User  . Types: Chew  Tobacco Comment   Not ready to Quit tobacco pouch use    Exercise Goals and Review: Exercise Goals    Row Name 12/14/17 1414             Exercise Goals   Increase Physical Activity  Yes       Intervention  Provide advice, education, support and counseling about physical activity/exercise needs.;Develop an individualized exercise prescription for aerobic and resistive training based on initial evaluation findings, risk stratification, comorbidities and participant's personal goals.       Expected Outcomes  Short Term: Attend rehab on a regular basis to increase amount of physical activity.;Long Term: Exercising regularly at least 3-5 days a week.;Long Term: Add in home exercise to make exercise part of routine and to increase amount of physical activity.       Increase Strength and Stamina  Yes       Intervention  Provide advice, education, support and counseling about physical activity/exercise needs.;Develop an individualized exercise prescription for aerobic and resistive training based on initial evaluation findings, risk stratification, comorbidities and participant's personal goals.       Expected Outcomes  Short Term: Increase workloads from initial exercise prescription for resistance, speed, and METs.;Short Term:  Perform resistance training exercises routinely during rehab and add in resistance training at home;Long Term: Improve cardiorespiratory fitness, muscular endurance and strength as measured by increased METs and functional capacity (6MWT)       Able to understand and use rate of perceived exertion (RPE) scale  Yes       Intervention  Provide education and explanation on how to use RPE scale       Expected Outcomes  Short Term: Able to use RPE daily in rehab to express subjective intensity level;Long Term:  Able to use RPE to guide intensity level when exercising independently       Knowledge and understanding of Target Heart Rate Range (THRR)  Yes       Intervention  Provide education and explanation of THRR including how the numbers were predicted and where they are located for reference       Expected Outcomes  Short Term: Able to state/look up THRR;Short Term: Able to use daily as guideline for intensity in rehab;Long Term: Able to use THRR to govern intensity when exercising independently       Able to check pulse independently  Yes       Intervention  Provide education and demonstration on how to check pulse in carotid and radial arteries.;Review the importance of being able to check your own pulse for safety during independent exercise       Expected Outcomes  Short Term: Able to explain why pulse checking is important during independent exercise;Long Term: Able to check pulse independently and accurately       Understanding of Exercise Prescription  Yes       Intervention  Provide education, explanation, and written materials on patient's individual exercise prescription       Expected Outcomes  Short Term: Able to explain program exercise prescription;Long Term: Able to explain home exercise prescription to exercise independently          Copy of goals given to participant.

## 2017-12-22 ENCOUNTER — Encounter: Payer: Medicare Other | Attending: Cardiology

## 2017-12-22 DIAGNOSIS — I5022 Chronic systolic (congestive) heart failure: Secondary | ICD-10-CM | POA: Diagnosis not present

## 2017-12-22 DIAGNOSIS — I10 Essential (primary) hypertension: Secondary | ICD-10-CM | POA: Diagnosis not present

## 2017-12-22 DIAGNOSIS — Z720 Tobacco use: Secondary | ICD-10-CM | POA: Diagnosis not present

## 2017-12-22 DIAGNOSIS — Z79899 Other long term (current) drug therapy: Secondary | ICD-10-CM | POA: Diagnosis not present

## 2017-12-22 DIAGNOSIS — E119 Type 2 diabetes mellitus without complications: Secondary | ICD-10-CM | POA: Diagnosis not present

## 2017-12-22 DIAGNOSIS — Z794 Long term (current) use of insulin: Secondary | ICD-10-CM | POA: Diagnosis not present

## 2017-12-22 LAB — GLUCOSE, CAPILLARY: GLUCOSE-CAPILLARY: 229 mg/dL — AB (ref 70–99)

## 2017-12-22 NOTE — Progress Notes (Signed)
Daily Session Note  Patient Details  Name: Carl Chen MRN: [id number] Date of Birth: September 07, 1940 Referring Provider:     Cardiac Rehab from 12/14/2017 in [facility]  Referring Provider  [name] MD      Encounter Date: 12/22/2017  Check In: Session Check In - 12/22/17 0808      Check-In   Supervising physician immediately available to respond to emergencies  See telemetry face sheet for immediately available ER MD    Location  [facility]-Cardiac & Pulmonary Rehab    Staff Present  Heath Lark, RN, BSN, CCRP;Jessica Huxley, MA, RCEP, CCRP, Exercise Physiologist;Amanda Oletta Darter, BA, ACSM CEP, Exercise Physiologist;Jeanna Durrell BS, Exercise Physiologist          Social History   Tobacco Use  Smoking Status Current Every Day Smoker  . Packs/day: 3.00  . Years: 60.00  . Pack years: 180.00  Smokeless Tobacco Current User  . Types: Chew  Tobacco Comment   Not ready to Quit tobacco pouch use    Goals Met:  Exercise tolerated well No report of cardiac concerns or symptoms Strength training completed today  Goals Unmet:  Not Applicable  Comments: First full day of exercise!  Patient was oriented to gym and equipment including functions, settings, policies, and procedures.  Patient's individual exercise prescription and treatment plan were reviewed.  All starting workloads were established based on the results of the 6 minute walk test done at initial orientation visit.  The plan for exercise progression was also introduced and progression will be customized based on patient's performance and goals.    Dr. Emily Filbert is Medical Director for [place] and [name].

## 2017-12-23 ENCOUNTER — Encounter: Payer: Self-pay | Admitting: *Deleted

## 2017-12-23 DIAGNOSIS — I5022 Chronic systolic (congestive) heart failure: Secondary | ICD-10-CM

## 2017-12-23 NOTE — Progress Notes (Signed)
Cardiac Individual Treatment Plan  Patient Details  Name: Carl Chen MRN: [id number] Date of Birth: Apr 07, 1940 Referring Provider:     Cardiac Rehab from 12/14/2017 in [facility]  Referring Provider  [name] MD      Initial Encounter Date:    Cardiac Rehab from 12/14/2017 in [facility] Cardiac and Pulmonary Rehab  Date  12/14/17      Visit Diagnosis: Heart failure, chronic systolic ([place])  Patient's Home Medications on Admission:  Current Outpatient Medications:  .  amLODipine (NORVASC) 5 MG tablet, , Disp: , Rfl:  .  amLODipine-olmesartan (AZOR) 5-20 MG per tablet, Take 1 tablet by mouth daily., Disp: , Rfl:  .  Cinnamon 500 MG capsule, Take by mouth., Disp: , Rfl:  .  ENTRESTO 24-26 MG, , Disp: , Rfl:  .  glipiZIDE (GLUCOTROL XL) 5 MG 24 hr tablet, Take 5 mg by mouth daily with breakfast., Disp: , Rfl:  .  Insulin Isophane & Regular Human (HUMULIN 70/30 KWIKPEN) (70-30) 100 UNIT/ML PEN, Inject into the skin., Disp: , Rfl:  .  Investigational - Study Medication, Take 35 mg by mouth daily. Take o each by mouth Daily before dinner 35 mg empaglifozin RAX/EN40 mg placebo. Study is being conducted by [employer] ,[place], Disp: , Rfl:  .  losartan (COZAAR) 50 MG tablet, Take 50 mg by mouth daily., Disp: , Rfl:  .  magnesium oxide (MAG-OX) 400 MG tablet, Take by mouth., Disp: , Rfl:  .  metFORMIN (GLUCOPHAGE) 1000 MG tablet, Take 1,000 mg by mouth 2 (two) times daily with a meal., Disp: , Rfl:  .  metoprolol succinate (TOPROL-XL) 25 MG 24 hr tablet, Take by mouth., Disp: , Rfl:  .  nystatin-triamcinolone ointment (MYCOLOG), Apply 1 application topically 2 (two) times daily., Disp: 30 g, Rfl: 0 .  simvastatin (ZOCOR) 20 MG tablet, Take 20 mg by mouth daily., Disp: , Rfl:  .  Urea (URAMAXIN EX), Apply topically., Disp: , Rfl:  .  vitamin B-12 (CYANOCOBALAMIN) 500 MCG tablet, Take by mouth., Disp: , Rfl:   Past Medical History: Past Medical History:   Diagnosis Date  . Diabetes ([place])   . Hypertension     Tobacco Use: Social History   Tobacco Use  Smoking Status Current Every Day Smoker  . Packs/day: 3.00  . Years: 60.00  . Pack years: 180.00  Smokeless Tobacco Current User  . Types: Chew  Tobacco Comment   Not ready to Quit tobacco pouch use    Labs: Recent Review Flowsheet Data    There is no flowsheet data to display.       Exercise Target Goals: Exercise Program Goal: Individual exercise prescription set using results from initial 6 min walk test and THRR while considering  patient's activity barriers and safety.   Exercise Prescription Goal: Initial exercise prescription builds to 30-45 minutes a day of aerobic activity, 2-3 days per week.  Home exercise guidelines will be given to patient during program as part of exercise prescription that the participant will acknowledge.  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 Has had cortisone shots in the past   Cardiac Risk Stratification  High       6 Minute Walk: 6 Minute Walk    Row Name 12/14/17  1411         6 Minute Walk   Phase  Initial     Distance  1360 feet     Walk Time  6 minutes     # of Rest Breaks  0     MPH  2.58     METS  3.05     RPE  9     VO2 Peak  10.67     Symptoms  No     Resting HR  76 bpm     Resting BP  126/74     Resting Oxygen Saturation   97 %     Exercise Oxygen Saturation  during 6 min walk  96 %     Max Ex. HR  80 bpm     Max Ex. BP  146/70     2 Minute Post BP  126/62        Oxygen Initial Assessment:   Oxygen Re-Evaluation:   Oxygen Discharge (Final Oxygen Re-Evaluation):   Initial Exercise Prescription: Initial Exercise Prescription - 12/14/17 1400      Date of  Initial Exercise RX and Referring Provider   Date  12/14/17    Referring Provider  [name] MD      Treadmill   MPH  2.5    Grade  0.5    Minutes  15    METs  3.09      NuStep   Level  3    SPM  80    Minutes  15    METs  3      REL-XR   Level  2    Speed  50    Minutes  15    METs  3      Prescription Details   Frequency (times per week)  2    Duration  Progress to 30 minutes of continuous aerobic without signs/symptoms of physical distress      Intensity   THRR 40-80% of Max Heartrate  104-131    Ratings of Perceived Exertion  11-13    Perceived Dyspnea  0-4      Progression   Progression  Continue to progress workloads to maintain intensity without signs/symptoms of physical distress.      Resistance Training   Training Prescription  Yes    Weight  4 lbs    Reps  10-15       Perform Capillary Blood Glucose checks as needed.  Exercise Prescription Changes: Exercise Prescription Changes    Row Name 12/14/17 1300             Response to Exercise   Blood Pressure (Admit)  126/74       Blood Pressure (Exercise)  146/70       Blood Pressure (Exit)  126/62       Heart Rate (Admit)  76 bpm       Heart Rate (Exercise)  80 bpm       Heart Rate (Exit)  75 bpm       Oxygen Saturation (Admit)  97 %       Oxygen Saturation (Exercise)  96 %       Rating of Perceived Exertion (Exercise)  9       Symptoms  none       Comments  walk test results          Exercise Comments: Exercise Comments    Row Name 12/22/17 [id number]  Exercise Comments   First full day of exercise!  Patient was oriented to gym and equipment including functions, settings, policies, and procedures.  Patient's individual exercise prescription and treatment plan were reviewed.  All starting workloads were established based on the results of the 6 minute walk test done at initial orientation visit.  The plan for exercise progression was also introduced and progression will be  customized based on patient's performance and goals.          Exercise Goals and Review: Exercise Goals    Row Name 12/14/17 1414             Exercise Goals   Increase Physical Activity  Yes       Intervention  Provide advice, education, support and counseling about physical activity/exercise needs.;Develop an individualized exercise prescription for aerobic and resistive training based on initial evaluation findings, risk stratification, comorbidities and participant's personal goals.       Expected Outcomes  Short Term: Attend rehab on a regular basis to increase amount of physical activity.;Long Term: Exercising regularly at least 3-5 days a week.;Long Term: Add in home exercise to make exercise part of routine and to increase amount of physical activity.       Increase Strength and Stamina  Yes       Intervention  Provide advice, education, support and counseling about physical activity/exercise needs.;Develop an individualized exercise prescription for aerobic and resistive training based on initial evaluation findings, risk stratification, comorbidities and participant's personal goals.       Expected Outcomes  Short Term: Increase workloads from initial exercise prescription for resistance, speed, and METs.;Short Term: Perform resistance training exercises routinely during rehab and add in resistance training at home;Long Term: Improve cardiorespiratory fitness, muscular endurance and strength as measured by increased METs and functional capacity (6MWT)       Able to understand and use rate of perceived exertion (RPE) scale  Yes       Intervention  Provide education and explanation on how to use RPE scale       Expected Outcomes  Short Term: Able to use RPE daily in rehab to express subjective intensity level;Long Term:  Able to use RPE to guide intensity level when exercising independently       Knowledge and understanding of Target Heart Rate Range (THRR)  Yes       Intervention   Provide education and explanation of THRR including how the numbers were predicted and where they are located for reference       Expected Outcomes  Short Term: Able to state/look up THRR;Short Term: Able to use daily as guideline for intensity in rehab;Long Term: Able to use THRR to govern intensity when exercising independently       Able to check pulse independently  Yes       Intervention  Provide education and demonstration on how to check pulse in carotid and radial arteries.;Review the importance of being able to check your own pulse for safety during independent exercise       Expected Outcomes  Short Term: Able to explain why pulse checking is important during independent exercise;Long Term: Able to check pulse independently and accurately       Understanding of Exercise Prescription  Yes       Intervention  Provide education, explanation, and written materials on patient's individual exercise prescription       Expected Outcomes  Short Term: Able to explain program exercise prescription;Long Term: Able to explain  home exercise prescription to exercise independently          Exercise Goals Re-Evaluation : Exercise Goals Re-Evaluation    Row Name 12/22/17 0810             Exercise Goal Re-Evaluation   Exercise Goals Review  Increase Physical Activity;Increase Strength and Stamina;Able to understand and use rate of perceived exertion (RPE) scale;Knowledge and understanding of Target Heart Rate Range (THRR);Understanding of Exercise Prescription       Comments  Reviewed RPE scale, THR and program prescription with pt today.  Pt voiced understanding and was given a copy of goals to take home.        Expected Outcomes  Short: Use RPE daily to regulate intensity. Long: Follow program prescription in THR.          Discharge Exercise Prescription (Final Exercise Prescription Changes): Exercise Prescription Changes - 12/14/17 1300      Response to Exercise   Blood Pressure (Admit)   126/74    Blood Pressure (Exercise)  146/70    Blood Pressure (Exit)  126/62    Heart Rate (Admit)  76 bpm    Heart Rate (Exercise)  80 bpm    Heart Rate (Exit)  75 bpm    Oxygen Saturation (Admit)  97 %    Oxygen Saturation (Exercise)  96 %    Rating of Perceived Exertion (Exercise)  9    Symptoms  none    Comments  walk test results       Nutrition:  Target Goals: Understanding of nutrition guidelines, daily intake of sodium <1578m, cholesterol <2073m calories 30% from fat and 7% or less from saturated fats, daily to have 5 or more servings of fruits and vegetables.  Biometrics: Pre Biometrics - 12/14/17 1415      Pre Biometrics   Height  5' 11.1" (1.806 m)    Weight  158 lb 1.6 oz (71.7 kg)    Waist Circumference  32 inches    Hip Circumference  38 inches    Waist to Hip Ratio  0.84 %    BMI (Calculated)  21.99    Single Leg Stand  15.3 seconds        Nutrition Therapy Plan and Nutrition Goals: Nutrition Therapy & Goals - 12/22/17 0857      Nutrition Therapy   Diet  DM    Protein (specify units)  12oz    Fiber  30 grams    Whole Grain Foods  3 servings   does not currently choose whole grain options   Saturated Fats  16 max. grams    Fruits and Vegetables  5 servings/day   8 ideal; likely not meeting recommended daily intake of fruits, eats mostly starchy vegetables   Sodium  1500 grams      Personal Nutrition Goals   Nutrition Goal  Eat at least one additional serving of fruit per week    Personal Goal #2  Because you desire weight gain but do not typically eat 3 meals/day or eat large portions at meal times, it may be helpful to be more consistent about drinking 1-2 Atkins/ Boost/ Glucerna shakes daily, and/or adding in an evening snack regularly    Personal Goal #3  Before coming to class for exercise, try to consume something small such as a granola bar rather than nothing    Comments  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 He reads labels to  choose items that are low  in sugar and sodium and tries to eat "lots of greens." Drinks sugar free beverages as well. He does not typically eat breakfast and may have a small dinner; lunch is his main meal. He will occasionall eat a granola bar or drink an Atkins shake in the mornings. Lunch/ Dinner: burger + fries, salad, sandwich. Does not snack often but typically chooses PB crackers if so. Eats canned soups regularly. He does not choose whole wheat products at this time. Dairy foods consumed: cheese.       Intervention Plan   Intervention  Prescribe, educate and counsel regarding individualized specific dietary modifications aiming towards targeted core components such as weight, hypertension, lipid management, diabetes, heart failure and other comorbidities.    Expected Outcomes  Short Term Goal: Understand basic principles of dietary content, such as calories, fat, sodium, cholesterol and nutrients.;Short Term Goal: A plan has been developed with personal nutrition goals set during dietitian appointment.;Long Term Goal: Adherence to prescribed nutrition plan.       Nutrition Assessments: Nutrition Assessments - 12/14/17 1356      MEDFICTS Scores   Pre Score  6       Nutrition Goals Re-Evaluation: Nutrition Goals Re-Evaluation    Row Name 12/22/17 0908             Goals   Nutrition Goal  Because you desire weight gain but do not typically eat 3 meals/day or eat large portions at meal times, it may be helpful to be more consistent about drinking 1-2 Atkins/ Boost/ Glucerna shakes daily, and/or adding in an evening snack regularly       Comment  He has been advised by his physician to gain some weight, but finds this challenging while taking Metformin. He also does not have a large appetite. His daughter purchased him Ensure to try but it did not effect his blood sugars in a positive way       Expected Outcome  He will either try drinking a low-glycemic nutritional shake 1-2x/day  consistently, or add in a snack in the evenings to help facilitate weight gain. Long term goal: 5-10# wt gain         Personal Goal #2 Re-Evaluation   Personal Goal #2  Eat at least one additional serving of fruit per week         Personal Goal #3 Re-Evaluation   Personal Goal #3  Before coming to class for exercise, try to consume something small such as a granola bar rather than nothing          Nutrition Goals Discharge (Final Nutrition Goals Re-Evaluation): Nutrition Goals Re-Evaluation - 12/22/17 0908      Goals   Nutrition Goal  Because you desire weight gain but do not typically eat 3 meals/day or eat large portions at meal times, it may be helpful to be more consistent about drinking 1-2 Atkins/ Boost/ Glucerna shakes daily, and/or adding in an evening snack regularly    Comment  He has been advised by his physician to gain some weight, but finds this challenging while taking Metformin. He also does not have a large appetite. His daughter purchased him Ensure to try but it did not effect his blood sugars in a positive way    Expected Outcome  He will either try drinking a low-glycemic nutritional shake 1-2x/day consistently, or add in a snack in the evenings to help facilitate weight gain. Long term goal: 5-10# wt gain      Personal Goal #2  Re-Evaluation   Personal Goal #2  Eat at least one additional serving of fruit per week      Personal Goal #3 Re-Evaluation   Personal Goal #3  Before coming to class for exercise, try to consume something small such as a granola bar rather than nothing       Psychosocial: Target Goals: Acknowledge presence or absence of significant depression and/or stress, maximize coping skills, provide positive support system. Participant is able to verbalize types and ability to use techniques and skills needed for reducing stress and depression.   Initial Review & Psychosocial Screening: Initial Psych Review & Screening - 12/14/17 1353      Initial  Review   Current issues with  None Identified      Family Dynamics   Good Support System?  Yes   daughter-Michelle     Barriers   Psychosocial barriers to participate in program  There are no identifiable barriers or psychosocial needs.;The patient should benefit from training in stress management and relaxation.      Screening Interventions   Interventions  Encouraged to exercise;To provide support and resources with identified psychosocial needs;Provide feedback about the scores to participant    Expected Outcomes  Short Term goal: Utilizing psychosocial counselor, staff and physician to assist with identification of specific Stressors or current issues interfering with healing process. Setting desired goal for each stressor or current issue identified.;Long Term Goal: Stressors or current issues are controlled or eliminated.;Short Term goal: Identification and review with participant of any Quality of Life or Depression concerns found by scoring the questionnaire.;Long Term goal: The participant improves quality of Life and PHQ9 Scores as seen by post scores and/or verbalization of changes       Quality of Life Scores:  Quality of Life - 12/14/17 1355      Quality of Life   Select  Quality of Life      Quality of Life Scores   Health/Function Pre  25.61 %    Socioeconomic Pre  23.57 %    Psych/Spiritual Pre  23.57 %    Family Pre  25.63 %    GLOBAL Pre  24.72 %      Scores of 19 and below usually indicate a poorer quality of life in these areas.  A difference of  2-3 points is a clinically meaningful difference.  A difference of 2-3 points in the total score of the Quality of Life Index has been associated with significant improvement in overall quality of life, self-image, physical symptoms, and general health in studies assessing change in quality of life.  PHQ-9: Recent Review Flowsheet Data    Depression screen [facility] 2/9 12/14/2017   Decreased Interest 0   Down, Depressed,  Hopeless 0   PHQ - 2 Score 0   Altered sleeping 0   Tired, decreased energy 0   Change in appetite 0   Feeling bad or failure about yourself  0   Trouble concentrating 0   Moving slowly or fidgety/restless 0   Suicidal thoughts 0   PHQ-9 Score 0   Difficult doing work/chores Not difficult at all     Interpretation of Total Score  Total Score Depression Severity:  1-4 = Minimal depression, 5-9 = Mild depression, 10-14 = Moderate depression, 15-19 = Moderately severe depression, 20-27 = Severe depression   Psychosocial Evaluation and Intervention:   Psychosocial Re-Evaluation:   Psychosocial Discharge (Final Psychosocial Re-Evaluation):   Vocational Rehabilitation: Provide vocational rehab assistance to qualifying candidates.  Vocational Rehab Evaluation & Intervention: Vocational Rehab - 12/14/17 1357      Initial Vocational Rehab Evaluation & Intervention   Assessment shows need for Vocational Rehabilitation  No       Education: Education Goals: Education classes will be provided on a variety of topics geared toward better understanding of heart health and risk factor modification. Participant will state understanding/return demonstration of topics presented as noted by education test scores.  Learning Barriers/Preferences: Learning Barriers/Preferences - 12/14/17 1356      Learning Barriers/Preferences   Learning Barriers  None    Learning Preferences  None       Education Topics:  AED/CPR: - Group verbal and written instruction with the use of models to demonstrate the basic use of the AED with the basic ABC's of resuscitation.   General Nutrition Guidelines/Fats and Fiber: -Group instruction provided by verbal, written material, models and posters to present the general guidelines for heart healthy nutrition. Gives an explanation and review of dietary fats and fiber.   Controlling Sodium/Reading Food Labels: -Group verbal and written material supporting  the discussion of sodium use in heart healthy nutrition. Review and explanation with models, verbal and written materials for utilization of the food label.   Exercise Physiology & General Exercise Guidelines: - Group verbal and written instruction with models to review the exercise physiology of the cardiovascular system and associated critical values. Provides general exercise guidelines with specific guidelines to those with heart or lung disease.    Aerobic Exercise & Resistance Training: - Gives group verbal and written instruction on the various components of exercise. Focuses on aerobic and resistive training programs and the benefits of this training and how to safely progress through these programs..   Flexibility, Balance, Mind/Body Relaxation: Provides group verbal/written instruction on the benefits of flexibility and balance training, including mind/body exercise modes such as yoga, pilates and tai chi.  Demonstration and skill practice provided.   Stress and Anxiety: - Provides group verbal and written instruction about the health risks of elevated stress and causes of high stress.  Discuss the correlation between heart/lung disease and anxiety and treatment options. Review healthy ways to manage with stress and anxiety.   Depression: - Provides group verbal and written instruction on the correlation between heart/lung disease and depressed mood, treatment options, and the stigmas associated with seeking treatment.   Anatomy & Physiology of the Heart: - Group verbal and written instruction and models provide basic cardiac anatomy and physiology, with the coronary electrical and arterial systems. Review of Valvular disease and Heart Failure   Cardiac Procedures: - Group verbal and written instruction to review commonly prescribed medications for heart disease. Reviews the medication, class of the drug, and side effects. Includes the steps to properly store meds and maintain  the prescription regimen. (beta blockers and nitrates)   Cardiac Medications I: - Group verbal and written instruction to review commonly prescribed medications for heart disease. Reviews the medication, class of the drug, and side effects. Includes the steps to properly store meds and maintain the prescription regimen.   Cardiac Medications II: -Group verbal and written instruction to review commonly prescribed medications for heart disease. Reviews the medication, class of the drug, and side effects. (all other drug classes)   Cardiac Rehab from 12/22/2017 in [facility] Cardiac and Pulmonary Rehab  Date  12/22/17  Educator  SB  Instruction Review Code  1- Verbalizes Understanding       Go Sex-Intimacy & Heart Disease, Get SMART -  Goal Setting: - Group verbal and written instruction through game format to discuss heart disease and the return to sexual intimacy. Provides group verbal and written material to discuss and apply goal setting through the application of the S.M.A.R.T. Method.   Other Matters of the Heart: - Provides group verbal, written materials and models to describe Stable Angina and Peripheral Artery. Includes description of the disease process and treatment options available to the cardiac patient.   Exercise & Equipment Safety: - Individual verbal instruction and demonstration of equipment use and safety with use of the equipment.   Cardiac Rehab from 12/22/2017 in [facility] Cardiac and Pulmonary Rehab  Date  12/14/17  Educator  [facility]  Instruction Review Code  1- Verbalizes Understanding      Infection Prevention: - Provides verbal and written material to individual with discussion of infection control including proper hand washing and proper equipment cleaning during exercise session.   Falls Prevention: - Provides verbal and written material to individual with discussion of falls prevention and safety.   Cardiac Rehab from 12/22/2017 in [facility] Cardiac and Pulmonary Rehab   Date  12/14/17  Educator  [facility]  Instruction Review Code  1- Verbalizes Understanding      Diabetes: - Individual verbal and written instruction to review signs/symptoms of diabetes, desired ranges of glucose level fasting, after meals and with exercise. Acknowledge that pre and post exercise glucose checks will be done for 3 sessions at entry of program.   Cardiac Rehab from 12/22/2017 in [facility] Cardiac and Pulmonary Rehab  Date  12/14/17  Educator  SB  Instruction Review Code  1- Verbalizes Understanding      Know Your Numbers and Risk Factors: -Group verbal and written instruction about important numbers in your health.  Discussion of what are risk factors and how they play a role in the disease process.  Review of Cholesterol, Blood Pressure, Diabetes, and BMI and the role they play in your overall health.   Cardiac Rehab from 12/22/2017 in [facility] [facility] Cardiac and Pulmonary Rehab  Date  12/22/17  Educator  SB  Instruction Review Code  1- Verbalizes Understanding      Sleep Hygiene: -Provides group verbal and written instruction about how sleep can affect your health.  Define sleep hygiene, discuss sleep cycles and impact of sleep habits. Review good sleep hygiene tips.    Other: -Provides group and verbal instruction on various topics (see comments)   Knowledge Questionnaire Score: Knowledge Questionnaire Score - 12/14/17 1356      Knowledge Questionnaire Score   Pre Score  22/26   Reviewed correct responses with [name] today. He verbalized understanding of the answers and had no further questions today.      Core Components/Risk Factors/Patient Goals at Admission: Personal Goals and Risk Factors at Admission - 12/14/17 1357      Core Components/Risk Factors/Patient Goals on Admission    Weight Management  Yes;Weight Gain    Intervention  Weight Management: Develop a combined nutrition and exercise program designed to reach desired caloric intake, while maintaining appropriate  intake of nutrient and fiber, sodium and fats, and appropriate energy expenditure required for the weight goal.;Weight Management: Provide education and appropriate resources to help participant work on and attain dietary goals.    Admit Weight  158 lb 1.6 oz (71.7 kg)    Goal Weight: Short Term  160 lb (72.6 kg)    Goal Weight: Long Term  165 lb (74.8 kg)    Expected Outcomes  Short Term:  Continue to assess and modify interventions until short term weight is achieved;Long Term: Adherence to nutrition and physical activity/exercise program aimed toward attainment of established weight goal;Weight Gain: Understanding of general recommendations for a high calorie, high protein meal plan that promotes weight gain by distributing calorie intake throughout the day with the consumption for 4-5 meals, snacks, and/or supplements   [name] has lost about 20 lbs and his doctor has asked him to gain some weight.   Tobacco Cessation  Yes   Reviewed steps toward quitting with [name] today. He does not state that he is ready to quit.    Number of packs per day  uses tobacco pouches about 3  a day    Intervention  Assist the participant in steps to quit. Provide individualized education and counseling about committing to Tobacco Cessation, relapse prevention, and pharmacological support that can be provided by physician.;Advice worker, assist with locating and accessing local/national Quit Smoking programs, and support quit date choice.    Expected Outcomes  Short Term: Will demonstrate readiness to quit, by selecting a quit date.;Short Term: Will quit all tobacco product use, adhering to prevention of relapse plan.;Long Term: Complete abstinence from all tobacco products for at least 12 months from quit date.    Diabetes  Yes    Intervention  Provide education about signs/symptoms and action to take for hypo/hyperglycemia.;Provide education about proper nutrition, including hydration, and  aerobic/resistive exercise prescription along with prescribed medications to achieve blood glucose in normal ranges: Fasting glucose 65-99 mg/dL    Expected Outcomes  Short Term: Participant verbalizes understanding of the signs/symptoms and immediate care of hyper/hypoglycemia, proper foot care and importance of medication, aerobic/resistive exercise and nutrition plan for blood glucose control.;Long Term: Attainment of HbA1C < 7%.    Heart Failure  Yes   [name] does not have scales at home.  Will check to see if we can get him some scales.    Intervention  Provide a combined exercise and nutrition program that is supplemented with education, support and counseling about heart failure. Directed toward relieving symptoms such as shortness of breath, decreased exercise tolerance, and extremity edema.    Expected Outcomes  Improve functional capacity of life;Short term: Attendance in program 2-3 days a week with increased exercise capacity. Reported lower sodium intake. Reported increased fruit and vegetable intake. Reports medication compliance.;Short term: Daily weights obtained and reported for increase. Utilizing diuretic protocols set by physician.;Long term: Adoption of self-care skills and reduction of barriers for early signs and symptoms recognition and intervention leading to self-care maintenance.    Lipids  Yes    Intervention  Provide education and support for participant on nutrition & aerobic/resistive exercise along with prescribed medications to achieve LDL <62m, HDL >465m    Expected Outcomes  Short Term: Participant states understanding of desired cholesterol values and is compliant with medications prescribed. Participant is following exercise prescription and nutrition guidelines.;Long Term: Cholesterol controlled with medications as prescribed, with individualized exercise RX and with personalized nutrition plan. Value goals: LDL < 7054mHDL > 40 mg.       Core Components/Risk  Factors/Patient Goals Review:    Core Components/Risk Factors/Patient Goals at Discharge (Final Review):    ITP Comments: ITP Comments    Row Name 12/14/17 1337 12/23/17 0556         ITP Comments  Medical evaluation completed today. ITP sent to Dr M MLoleta Chancer review,changes as needed and signature. [facility]  30 day review. Continue with ITP unless direccted changes per Medical Director Chart Review. New to  program         Comments:

## 2017-12-24 ENCOUNTER — Encounter: Payer: Medicare Other | Admitting: *Deleted

## 2017-12-24 DIAGNOSIS — I5022 Chronic systolic (congestive) heart failure: Secondary | ICD-10-CM

## 2017-12-24 LAB — GLUCOSE, CAPILLARY
GLUCOSE-CAPILLARY: 140 mg/dL — AB (ref 70–99)
GLUCOSE-CAPILLARY: 153 mg/dL — AB (ref 70–99)

## 2017-12-24 NOTE — Progress Notes (Signed)
Daily Session Note  Patient Details  Name: Carl Chen MRN: [id number] Date of Birth: 1940-04-14 Referring Provider:     Cardiac Rehab from 12/14/2017 in [facility] Cardiac and Pulmonary Rehab  Referring Provider  [name] MD      Encounter Date: 12/24/2017  Check In: Session Check In - 12/24/17 0801      Check-In   Supervising physician immediately available to respond to emergencies  See telemetry face sheet for immediately available ER MD    Location  [facility]-Cardiac & Pulmonary Rehab    Staff Present  Vida Rigger RN, BSN;Jeanna Durrell BS, Exercise Physiologist;Val Farnam Luan Pulling, MA, RCEP, CCRP, Exercise Physiologist;Amanda Oletta Darter, BA, ACSM CEP, Exercise Physiologist    Medication changes reported      No    Fall or balance concerns reported     No    Warm-up and Cool-down  Performed as group-led instruction    Resistance Training Performed  Yes    VAD Patient?  No    PAD/SET Patient?  No      Pain Assessment   Currently in Pain?  No/denies          Social History   Tobacco Use  Smoking Status Current Every Day Smoker  . Packs/day: 3.00  . Years: 60.00  . Pack years: 180.00  Smokeless Tobacco Current User  . Types: Chew  Tobacco Comment   Not ready to Quit tobacco pouch use    Goals Met:  Independence with exercise equipment Exercise tolerated well No report of cardiac concerns or symptoms Strength training completed today  Goals Unmet:  Not Applicable  Comments: Reviewed home exercise with pt today.  Pt plans to walk for exercise.  Reviewed THR, pulse, RPE, sign and symptoms, NTG use, and when to call 911 or MD.  Also discussed weather considerations and indoor options.  Pt voiced understanding.   Dr. Emily Filbert is Medical Director for [place] and [name].

## 2017-12-29 DIAGNOSIS — I5022 Chronic systolic (congestive) heart failure: Secondary | ICD-10-CM

## 2017-12-29 LAB — GLUCOSE, CAPILLARY
GLUCOSE-CAPILLARY: 199 mg/dL — AB (ref 70–99)
Glucose-Capillary: 165 mg/dL — ABNORMAL HIGH (ref 70–99)

## 2017-12-29 NOTE — Progress Notes (Signed)
Daily Session Note  Patient Details  Name: Carl Chen MRN: [id number] Date of Birth: 03-06-1940 Referring Provider:     Cardiac Rehab from 12/14/2017 in [facility] Cardiac and Pulmonary Rehab  Referring Provider  [name] MD      Encounter Date: 12/29/2017  Check In: Session Check In - 12/29/17 0907      Check-In   Supervising physician immediately available to respond to emergencies  See telemetry face sheet for immediately available ER MD    Location  [facility]-Cardiac & Pulmonary Rehab    Staff Present  Heath Lark, RN, BSN, CCRP;Jessica [place], MA, RCEP, CCRP, Exercise Physiologist;Makell Drohan BS, Exercise Physiologist    Medication changes reported      No    Fall or balance concerns reported     No    Warm-up and Cool-down  Performed as group-led instruction    Resistance Training Performed  Yes    VAD Patient?  No    PAD/SET Patient?  No      Pain Assessment   Currently in Pain?  No/denies          Social History   Tobacco Use  Smoking Status Current Every Day Smoker  . Packs/day: 3.00  . Years: 60.00  . Pack years: 180.00  Smokeless Tobacco Current User  . Types: Chew  Tobacco Comment   Not ready to Quit tobacco pouch use    Goals Met:  Independence with exercise equipment Exercise tolerated well No report of cardiac concerns or symptoms Strength training completed today  Goals Unmet:  Not Applicable  Comments: Pt able to follow exercise prescription today without complaint.  Will continue to monitor for progression.  Reviewed home exercise with pt today.  Pt plans to walk at home for exercise.  Reviewed THR, pulse, RPE, sign and symptoms, NTG use, and when to call 911 or MD.  Also discussed weather considerations and indoor options.  Pt voiced understanding.   Dr. Emily Filbert is Medical Director for [place] and [name].

## 2017-12-31 ENCOUNTER — Encounter: Payer: Self-pay | Admitting: *Deleted

## 2017-12-31 ENCOUNTER — Encounter: Payer: Medicare Other | Admitting: *Deleted

## 2017-12-31 DIAGNOSIS — I5022 Chronic systolic (congestive) heart failure: Secondary | ICD-10-CM | POA: Diagnosis not present

## 2017-12-31 NOTE — Progress Notes (Signed)
Daily Session Note  Patient Details  Name: Carl Chen MRN: [id number] Date of Birth: 1940-07-24 Referring Provider:     Cardiac Rehab from 12/14/2017 in [facility] Cardiac and Pulmonary Rehab  Referring Provider  [name] MD      Encounter Date: 12/31/2017  Check In: Session Check In - 12/31/17 0757      Check-In   Supervising physician immediately available to respond to emergencies  See telemetry face sheet for immediately available ER MD    Location  [facility]-Cardiac & Pulmonary Rehab    Staff Present  Alberteen Sam, MA, RCEP, CCRP, Exercise Physiologist;Jeanna Durrell BS, Exercise Physiologist;Carroll Enterkin, Therapist, sports, BSN    Medication changes reported      No    Fall or balance concerns reported     No    Warm-up and Cool-down  Performed as group-led instruction    Resistance Training Performed  Yes    VAD Patient?  No    PAD/SET Patient?  No      Pain Assessment   Currently in Pain?  No/denies          Social History   Tobacco Use  Smoking Status Current Every Day Smoker  . Packs/day: 3.00  . Years: 60.00  . Pack years: 180.00  Smokeless Tobacco Current User  . Types: Chew  Tobacco Comment   Not ready to Quit tobacco pouch use    Goals Met:  Independence with exercise equipment Exercise tolerated well Personal goals reviewed No report of cardiac concerns or symptoms Strength training completed today  Goals Unmet:  Not Applicable  Comments: Pt able to follow exercise prescription today without complaint.  Will continue to monitor for progression.    Dr. Emily Filbert is Medical Director for [place] and [name].

## 2018-01-05 ENCOUNTER — Encounter: Payer: Medicare Other | Admitting: *Deleted

## 2018-01-05 DIAGNOSIS — I5022 Chronic systolic (congestive) heart failure: Secondary | ICD-10-CM | POA: Diagnosis not present

## 2018-01-05 NOTE — Progress Notes (Signed)
Daily Session Note  Patient Details  Name: Carl Chen MRN: [id number] Date of Birth: Mar 19, 1940 Referring Provider:     Cardiac Rehab from 12/14/2017 in [facility] Cardiac and Pulmonary Rehab  Referring Provider  [name] MD      Encounter Date: 01/05/2018  Check In: Session Check In - 01/05/18 0818      Check-In   Supervising physician immediately available to respond to emergencies  See telemetry face sheet for immediately available ER MD    Location  [facility]-Cardiac & Pulmonary Rehab    Staff Present  Alberteen Sam, MA, RCEP, CCRP, Exercise Physiologist;Jeanna Durrell BS, Exercise Physiologist;Krista Frederico Hamman, RN BSN    Medication changes reported      No    Fall or balance concerns reported     No    Warm-up and Cool-down  Performed as group-led Higher education careers adviser Performed  Yes    VAD Patient?  No    PAD/SET Patient?  No      Pain Assessment   Currently in Pain?  No/denies          Social History   Tobacco Use  Smoking Status Former Smoker  . Packs/day: 3.00  . Years: 60.00  . Pack years: 180.00  Smokeless Tobacco Current User  . Types: Chew  Tobacco Comment   12/12  Down to three pouches a day.    Goals Met:  Independence with exercise equipment Exercise tolerated well No report of cardiac concerns or symptoms Strength training completed today  Goals Unmet:  Not Applicable  Comments: Pt able to follow exercise prescription today without complaint.  Will continue to monitor for progression.    Dr. Emily Filbert is Medical Director for [place] and [name].

## 2018-01-07 ENCOUNTER — Encounter: Payer: Medicare Other | Admitting: *Deleted

## 2018-01-07 DIAGNOSIS — I5022 Chronic systolic (congestive) heart failure: Secondary | ICD-10-CM

## 2018-01-07 NOTE — Progress Notes (Signed)
Daily Session Note  Patient Details  Name: Carl Chen MRN: [id number] Date of Birth: 25-Mar-1940 Referring Provider:     Cardiac Rehab from 12/14/2017 in [facility] Cardiac and Pulmonary Rehab  Referring Provider  [name] MD      Encounter Date: 01/07/2018  Check In: Session Check In - 01/07/18 0804      Check-In   Supervising physician immediately available to respond to emergencies  See telemetry face sheet for immediately available ER MD    Location  [facility]-Cardiac & Pulmonary Rehab    Staff Present  Gerlene Burdock, RN, BSN;Jessica Luan Pulling, MA, RCEP, CCRP, Exercise Physiologist;Jeanna Durrell BS, Exercise Physiologist    Medication changes reported      No    Fall or balance concerns reported     No    Tobacco Cessation  No Change    Warm-up and Cool-down  Performed as group-led instruction    Resistance Training Performed  Yes    VAD Patient?  No    PAD/SET Patient?  No      Pain Assessment   Currently in Pain?  No/denies          Social History   Tobacco Use  Smoking Status Former Smoker  . Packs/day: 3.00  . Years: 60.00  . Pack years: 180.00  Smokeless Tobacco Current User  . Types: Chew  Tobacco Comment   12/12  Down to three pouches a day.    Goals Met:  Proper associated with RPD/PD & O2 Sat Independence with exercise equipment No report of cardiac concerns or symptoms Strength training completed today  Goals Unmet:  Not Applicable  Comments:     Dr. Emily Filbert is Medical Director for [place] and [name].

## 2018-01-12 ENCOUNTER — Encounter: Payer: Medicare Other | Admitting: *Deleted

## 2018-01-12 DIAGNOSIS — I5022 Chronic systolic (congestive) heart failure: Secondary | ICD-10-CM | POA: Diagnosis not present

## 2018-01-12 NOTE — Progress Notes (Signed)
Daily Session Note  Patient Details  Name: Carl Chen MRN: [id number] Date of Birth: Jul 23, 1940 Referring Provider:     Cardiac Rehab from 12/14/2017 in [facility] Cardiac and Pulmonary Rehab  Referring Provider  [name] MD      Encounter Date: 01/12/2018  Check In: Session Check In - 01/12/18 0846      Check-In   Supervising physician immediately available to respond to emergencies  See telemetry face sheet for immediately available ER MD    Location  [facility]-Cardiac & Pulmonary Rehab    Staff Present  Heath Lark, RN, BSN, CCRP;Ashlie Mcmenamy Sanderson, MA, RCEP, CCRP, Exercise Physiologist;Amanda Oletta Darter, BA, ACSM CEP, Exercise Physiologist    Medication changes reported      No    Fall or balance concerns reported     No    Warm-up and Cool-down  Performed as group-led instruction    Resistance Training Performed  Yes    VAD Patient?  No    PAD/SET Patient?  No      Pain Assessment   Currently in Pain?  No/denies          Social History   Tobacco Use  Smoking Status Former Smoker  . Packs/day: 3.00  . Years: 60.00  . Pack years: 180.00  Smokeless Tobacco Current User  . Types: Chew  Tobacco Comment   12/12  Down to three pouches a day.    Goals Met:  Independence with exercise equipment Exercise tolerated well No report of cardiac concerns or symptoms Strength training completed today  Goals Unmet:  Not Applicable  Comments: Pt able to follow exercise prescription today without complaint.  Will continue to monitor for progression.    Dr. Emily Filbert is Medical Director for [place] and [name].

## 2018-01-14 ENCOUNTER — Encounter: Payer: Medicare Other | Admitting: *Deleted

## 2018-01-14 DIAGNOSIS — I5022 Chronic systolic (congestive) heart failure: Secondary | ICD-10-CM | POA: Diagnosis not present

## 2018-01-14 NOTE — Progress Notes (Signed)
Daily Session Note  Patient Details  Name: Carl Chen MRN: [id number] Date of Birth: 04/11/1940 Referring Provider:     Cardiac Rehab from 12/14/2017 in [facility] Cardiac and Pulmonary Rehab  Referring Provider  [name] MD      Encounter Date: 01/14/2018  Check In: Session Check In - 01/14/18 0755      Check-In   Supervising physician immediately available to respond to emergencies  See telemetry face sheet for immediately available ER MD    Location  [facility]-Cardiac & Pulmonary Rehab    Staff Present  Jasper Loser BS, Exercise Physiologist;Carroll Enterkin, RN, Levie Heritage, MA, RCEP, CCRP, Exercise Physiologist    Medication changes reported      No    Fall or balance concerns reported     No    Warm-up and Cool-down  Performed on first and last piece of equipment    Resistance Training Performed  Yes    VAD Patient?  No    PAD/SET Patient?  No      Pain Assessment   Currently in Pain?  No/denies          Social History   Tobacco Use  Smoking Status Former Smoker  . Packs/day: 3.00  . Years: 60.00  . Pack years: 180.00  Smokeless Tobacco Current User  . Types: Chew  Tobacco Comment   12/12  Down to three pouches a day.    Goals Met:  Independence with exercise equipment Exercise tolerated well No report of cardiac concerns or symptoms Strength training completed today  Goals Unmet:  Not Applicable  Comments: Pt able to follow exercise prescription today without complaint.  Will continue to monitor for progression.    Dr. Emily Filbert is Medical Director for [place] and [name].

## 2018-01-18 NOTE — Progress Notes (Signed)
Cardiac Individual Treatment Plan  Patient Details  Name: Carl Chen MRN: [id number] Date of Birth: 12-Sep-1940 Referring Provider:     Cardiac Rehab from 12/14/2017 in [facility] Cardiac and Pulmonary Rehab  Referring Provider  [name] MD      Initial Encounter Date:    Cardiac Rehab from 12/14/2017 in [facility]  Date  12/14/17      Visit Diagnosis: Heart failure, chronic systolic ([place])  Patient's Home Medications on Admission:  Current Outpatient Medications:  .  amLODipine (NORVASC) 5 MG tablet, , Disp: , Rfl:  .  amLODipine-olmesartan (AZOR) 5-20 MG per tablet, Take 1 tablet by mouth daily., Disp: , Rfl:  .  Cinnamon 500 MG capsule, Take by mouth., Disp: , Rfl:  .  ENTRESTO 24-26 MG, , Disp: , Rfl:  .  glipiZIDE (GLUCOTROL XL) 5 MG 24 hr tablet, Take 5 mg by mouth daily with breakfast., Disp: , Rfl:  .  Insulin Isophane & Regular Human (HUMULIN 70/30 KWIKPEN) (70-30) 100 UNIT/ML PEN, Inject into the skin., Disp: , Rfl:  .  Investigational - Study Medication, Take 35 mg by mouth daily. Take o each by mouth Daily before dinner 35 mg empaglifozin FUX/NA35 mg placebo. Study is being conducted by [employer] ,[place],, Disp: , Rfl:  .  losartan (COZAAR) 50 MG tablet, Take 50 mg by mouth daily., Disp: , Rfl:  .  magnesium oxide (MAG-OX) 400 MG tablet, Take by mouth., Disp: , Rfl:  .  metFORMIN (GLUCOPHAGE) 1000 MG tablet, Take 1,000 mg by mouth 2 (two) times daily with a meal., Disp: , Rfl:  .  metoprolol succinate (TOPROL-XL) 25 MG 24 hr tablet, Take by mouth., Disp: , Rfl:  .  nystatin-triamcinolone ointment (MYCOLOG), Apply 1 application topically 2 (two) times daily., Disp: 30 g, Rfl: 0 .  simvastatin (ZOCOR) 20 MG tablet, Take 20 mg by mouth daily., Disp: , Rfl:  .  Urea (URAMAXIN EX), Apply topically., Disp: , Rfl:  .  vitamin B-12 (CYANOCOBALAMIN) 500 MCG tablet, Take by mouth., Disp: , Rfl:   Past Medical History: Past Medical History:   Diagnosis Date  . Diabetes ([place])   . Hypertension     Tobacco Use: Social History   Tobacco Use  Smoking Status Former Smoker  . Packs/day: 3.00  . Years: 60.00  . Pack years: 180.00  Smokeless Tobacco Current User  . Types: Chew  Tobacco Comment   12/12  Down to three pouches a day.    Labs: Recent Review Flowsheet Data    There is no flowsheet data to display.       Exercise Target Goals: Exercise Program Goal: Individual exercise prescription set using results from initial 6 min walk test and THRR while considering  patient's activity barriers and safety.   Exercise Prescription Goal: Initial exercise prescription builds to 30-45 minutes a day of aerobic activity, 2-3 days per week.  Home exercise guidelines will be given to patient during program as part of exercise prescription that the participant will acknowledge.  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 Has had cortisone shots in the past   Cardiac Risk Stratification  High       6 Minute Walk: 6 Minute Walk    Row Name 12/14/17 1411  6 Minute Walk   Phase  Initial     Distance  1360 feet     Walk Time  6 minutes     # of Rest Breaks  0     MPH  2.58     METS  3.05     RPE  9     VO2 Peak  10.67     Symptoms  No     Resting HR  76 bpm     Resting BP  126/74     Resting Oxygen Saturation   97 %     Exercise Oxygen Saturation  during 6 min walk  96 %     Max Ex. HR  80 bpm     Max Ex. BP  146/70     2 Minute Post BP  126/62        Oxygen Initial Assessment:   Oxygen Re-Evaluation:   Oxygen Discharge (Final Oxygen Re-Evaluation):   Initial Exercise Prescription: Initial Exercise Prescription - 12/14/17 1400      Date of Initial  Exercise RX and Referring Provider   Date  12/14/17    Referring Provider  [name] MD      Treadmill   MPH  2.5    Grade  0.5    Minutes  15    METs  3.09      NuStep   Level  3    SPM  80    Minutes  15    METs  3      REL-XR   Level  2    Speed  50    Minutes  15    METs  3      Prescription Details   Frequency (times per week)  2    Duration  Progress to 30 minutes of continuous aerobic without signs/symptoms of physical distress      Intensity   THRR 40-80% of Max Heartrate  104-131    Ratings of Perceived Exertion  11-13    Perceived Dyspnea  0-4      Progression   Progression  Continue to progress workloads to maintain intensity without signs/symptoms of physical distress.      Resistance Training   Training Prescription  Yes    Weight  4 lbs    Reps  10-15       Perform Capillary Blood Glucose checks as needed.  Exercise Prescription Changes: Exercise Prescription Changes    Row Name 12/14/17 1300 12/24/17 0800 12/29/17 0900 01/11/18 1600       Response to Exercise   Blood Pressure (Admit)  126/74  -  122/74  120/62    Blood Pressure (Exercise)  146/70  -  136/70  134/70    Blood Pressure (Exit)  126/62  -  122/74  128/70    Heart Rate (Admit)  76 bpm  -  66 bpm  80 bpm    Heart Rate (Exercise)  80 bpm  -  104 bpm  103 bpm    Heart Rate (Exit)  75 bpm  -  81 bpm  97 bpm    Oxygen Saturation (Admit)  97 %  -  -  -    Oxygen Saturation (Exercise)  96 %  -  -  -    Rating of Perceived Exertion (Exercise)  9  -  11  9    Symptoms  none  -  none  none    Comments  walk test  results  -  -  -    Duration  -  -  Continue with 30 min of aerobic exercise without signs/symptoms of physical distress.  Continue with 30 min of aerobic exercise without signs/symptoms of physical distress.    Intensity  -  -  THRR unchanged  THRR unchanged      Progression   Progression  -  -  Continue to progress workloads to maintain intensity without signs/symptoms of  physical distress.  Continue to progress workloads to maintain intensity without signs/symptoms of physical distress.    Average METs  -  -  3.03  3.63      Resistance Training   Training Prescription  -  -  Yes  Yes    Weight  -  -  4 lbs  4 lbs    Reps  -  -  10-15  10-15      Interval Training   Interval Training  -  -  No  No      Treadmill   MPH  -  -  2.5  3    Grade  -  -  0.5  0.5    Minutes  -  -  15  15    METs  -  -  3.09  3.5      NuStep   Level  -  -  4  4    Minutes  -  -  15  15    METs  -  -  3  2.9      REL-XR   Level  -  -  2  5    Minutes  -  -  15  15    METs  -  -  3  4.5      Home Exercise Plan   Plans to continue exercise at  -  Home (comment) walk  Home (comment) walk  Home (comment) walk    Frequency  -  Add 3 additional days to program exercise sessions.  Add 3 additional days to program exercise sessions.  Add 3 additional days to program exercise sessions.    Initial Home Exercises Provided  -  12/24/17  12/24/17  12/24/17       Exercise Comments: Exercise Comments    Row Name 12/22/17 0809 12/24/17 0837         Exercise Comments   First full day of exercise!  Patient was oriented to gym and equipment including functions, settings, policies, and procedures.  Patient's individual exercise prescription and treatment plan were reviewed.  All starting workloads were established based on the results of the 6 minute walk test done at initial orientation visit.  The plan for exercise progression was also introduced and progression will be customized based on patient's performance and goals.  Reviewed home exercise with pt today.  Pt plans to walk for exercise.  Reviewed THR, pulse, RPE, sign and symptoms, NTG use, and when to call 911 or MD.  Also discussed weather considerations and indoor options.  Pt voiced understanding.         Exercise Goals and Review: Exercise Goals    Row Name 12/14/17 1414             Exercise Goals   Increase  Physical Activity  Yes       Intervention  Provide advice, education, support and counseling about physical activity/exercise needs.;Develop an individualized exercise prescription for aerobic and resistive training based on  initial evaluation findings, risk stratification, comorbidities and participant's personal goals.       Expected Outcomes  Short Term: Attend rehab on a regular basis to increase amount of physical activity.;Long Term: Exercising regularly at least 3-5 days a week.;Long Term: Add in home exercise to make exercise part of routine and to increase amount of physical activity.       Increase Strength and Stamina  Yes       Intervention  Provide advice, education, support and counseling about physical activity/exercise needs.;Develop an individualized exercise prescription for aerobic and resistive training based on initial evaluation findings, risk stratification, comorbidities and participant's personal goals.       Expected Outcomes  Short Term: Increase workloads from initial exercise prescription for resistance, speed, and METs.;Short Term: Perform resistance training exercises routinely during rehab and add in resistance training at home;Long Term: Improve cardiorespiratory fitness, muscular endurance and strength as measured by increased METs and functional capacity (6MWT)       Able to understand and use rate of perceived exertion (RPE) scale  Yes       Intervention  Provide education and explanation on how to use RPE scale       Expected Outcomes  Short Term: Able to use RPE daily in rehab to express subjective intensity level;Long Term:  Able to use RPE to guide intensity level when exercising independently       Knowledge and understanding of Target Heart Rate Range (THRR)  Yes       Intervention  Provide education and explanation of THRR including how the numbers were predicted and where they are located for reference       Expected Outcomes  Short Term: Able to state/look up  THRR;Short Term: Able to use daily as guideline for intensity in rehab;Long Term: Able to use THRR to govern intensity when exercising independently       Able to check pulse independently  Yes       Intervention  Provide education and demonstration on how to check pulse in carotid and radial arteries.;Review the importance of being able to check your own pulse for safety during independent exercise       Expected Outcomes  Short Term: Able to explain why pulse checking is important during independent exercise;Long Term: Able to check pulse independently and accurately       Understanding of Exercise Prescription  Yes       Intervention  Provide education, explanation, and written materials on patient's individual exercise prescription       Expected Outcomes  Short Term: Able to explain program exercise prescription;Long Term: Able to explain home exercise prescription to exercise independently          Exercise Goals Re-Evaluation : Exercise Goals Re-Evaluation    Row Name 12/22/17 0810 12/24/17 0837 12/29/17 0943 12/31/17 0807 01/11/18 1619     Exercise Goal Re-Evaluation   Exercise Goals Review  Increase Physical Activity;Increase Strength and Stamina;Able to understand and use rate of perceived exertion (RPE) scale;Knowledge and understanding of Target Heart Rate Range (THRR);Understanding of Exercise Prescription  Increase Physical Activity;Increase Strength and Stamina;Able to understand and use rate of perceived exertion (RPE) scale;Knowledge and understanding of Target Heart Rate Range (THRR);Able to check pulse independently;Understanding of Exercise Prescription  Increase Physical Activity;Increase Strength and Stamina;Able to understand and use rate of perceived exertion (RPE) scale;Knowledge and understanding of Target Heart Rate Range (THRR);Able to check pulse independently;Understanding of Exercise Prescription  Increase Physical Activity;Increase Strength and  Stamina;Understanding of  Exercise Prescription  Increase Physical Activity;Increase Strength and Stamina;Understanding of Exercise Prescription   Comments  Reviewed RPE scale, THR and program prescription with pt today.  Pt voiced understanding and was given a copy of goals to take home.   [name] already walks for exercise on days not at [facility].  He will check HR and add some stretching and strength work to walking.  Reviewed home exercise with pt today.  Pt plans to walk at home for exercise.  Reviewed THR, pulse, RPE, sign and symptoms, NTG use, and when to call 911 or MD.  Also discussed weather considerations and indoor options.  Pt voiced understanding.  [name] is doing well in rehab.  He has enjoyed coming and has not been getting out with the the cold as much. He is already interested in [employer].  He is starting to notice that his strength and stamina are coming back.   Cecille Rubin continues to do well in rehab.  He is up to level 5 on the XR and 3.0 mph on the treadmill.  We will continue to monitor his progression.    Expected Outcomes  Short: Use RPE daily to regulate intensity. Long: Follow program prescription in THR.  Short - check HR while walking and add strength work Long - mainain exercise on his own  Short: Start walking more at home. Long: Continue to exercise indpendently  Short: Continue to attend rehab regularly.  Long: Continue to increase strength and stamina.   Short: Continue to increase workloads to reach RPE of 11-13.  Long: Continue to exercise on off days.       Discharge Exercise Prescription (Final Exercise Prescription Changes): Exercise Prescription Changes - 01/11/18 1600      Response to Exercise   Blood Pressure (Admit)  120/62    Blood Pressure (Exercise)  134/70    Blood Pressure (Exit)  128/70    Heart Rate (Admit)  80 bpm    Heart Rate (Exercise)  103 bpm    Heart Rate (Exit)  97 bpm    Rating of Perceived Exertion (Exercise)  9    Symptoms  none    Duration  Continue with 30 min of  aerobic exercise without signs/symptoms of physical distress.    Intensity  THRR unchanged      Progression   Progression  Continue to progress workloads to maintain intensity without signs/symptoms of physical distress.    Average METs  3.63      Resistance Training   Training Prescription  Yes    Weight  4 lbs    Reps  10-15      Interval Training   Interval Training  No      Treadmill   MPH  3    Grade  0.5    Minutes  15    METs  3.5      NuStep   Level  4    Minutes  15    METs  2.9      REL-XR   Level  5    Minutes  15    METs  4.5      Home Exercise Plan   Plans to continue exercise at  Home (comment)   walk   Frequency  Add 3 additional days to program exercise sessions.    Initial Home Exercises Provided  12/24/17       Nutrition:  Target Goals: Understanding of nutrition guidelines, daily intake of sodium <1569m, cholesterol <2064m  calories 30% from fat and 7% or less from saturated fats, daily to have 5 or more servings of fruits and vegetables.  Biometrics: Pre Biometrics - 12/14/17 1415      Pre Biometrics   Height  5' 11.1" (1.806 m)    Weight  158 lb 1.6 oz (71.7 kg)    Waist Circumference  32 inches    Hip Circumference  38 inches    Waist to Hip Ratio  0.84 %    BMI (Calculated)  21.99    Single Leg Stand  15.3 seconds        Nutrition Therapy Plan and Nutrition Goals: Nutrition Therapy & Goals - 12/22/17 0857      Nutrition Therapy   Diet  DM    Protein (specify units)  12oz    Fiber  30 grams    Whole Grain Foods  3 servings   does not currently choose whole grain options   Saturated Fats  16 max. grams    Fruits and Vegetables  5 servings/day   8 ideal; likely not meeting recommended daily intake of fruits, eats mostly starchy vegetables   Sodium  1500 grams      Personal Nutrition Goals   Nutrition Goal  Eat at least one additional serving of fruit per week    Personal Goal #2  Because you desire weight gain but do not  typically eat 3 meals/day or eat large portions at meal times, it may be helpful to be more consistent about drinking 1-2 Atkins/ Boost/ Glucerna shakes daily, and/or adding in an evening snack regularly    Personal Goal #3  Before coming to class for exercise, try to consume something small such as a granola bar rather than nothing    Comments  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 He reads labels to choose items that are low in sugar and sodium and tries to eat "lots of greens." Drinks sugar free beverages as well. He does not typically eat breakfast and may have a small dinner; lunch is his main meal. He will occasionall eat a granola bar or drink an Atkins shake in the mornings. Lunch/ Dinner: burger + fries, salad, sandwich. Does not snack often but typically chooses PB crackers if so. Eats canned soups regularly. He does not choose whole wheat products at this time. Dairy foods consumed: cheese.       Intervention Plan   Intervention  Prescribe, educate and counsel regarding individualized specific dietary modifications aiming towards targeted core components such as weight, hypertension, lipid management, diabetes, heart failure and other comorbidities.    Expected Outcomes  Short Term Goal: Understand basic principles of dietary content, such as calories, fat, sodium, cholesterol and nutrients.;Short Term Goal: A plan has been developed with personal nutrition goals set during dietitian appointment.;Long Term Goal: Adherence to prescribed nutrition plan.       Nutrition Assessments: Nutrition Assessments - 12/14/17 1356      MEDFICTS Scores   Pre Score  6       Nutrition Goals Re-Evaluation: Nutrition Goals Re-Evaluation    Row Name 12/22/17 0908             Goals   Nutrition Goal  Because you desire weight gain but do not typically eat 3 meals/day or eat large portions at meal times, it may be helpful to be more consistent about drinking 1-2 Atkins/ Boost/ Glucerna  shakes daily, and/or adding in an evening snack regularly  Comment  He has been advised by his physician to gain some weight, but finds this challenging while taking Metformin. He also does not have a large appetite. His daughter purchased him Ensure to try but it did not effect his blood sugars in a positive way       Expected Outcome  He will either try drinking a low-glycemic nutritional shake 1-2x/day consistently, or add in a snack in the evenings to help facilitate weight gain. Long term goal: 5-10# wt gain         Personal Goal #2 Re-Evaluation   Personal Goal #2  Eat at least one additional serving of fruit per week         Personal Goal #3 Re-Evaluation   Personal Goal #3  Before coming to class for exercise, try to consume something small such as a granola bar rather than nothing          Nutrition Goals Discharge (Final Nutrition Goals Re-Evaluation): Nutrition Goals Re-Evaluation - 12/22/17 0908      Goals   Nutrition Goal  Because you desire weight gain but do not typically eat 3 meals/day or eat large portions at meal times, it may be helpful to be more consistent about drinking 1-2 Atkins/ Boost/ Glucerna shakes daily, and/or adding in an evening snack regularly    Comment  He has been advised by his physician to gain some weight, but finds this challenging while taking Metformin. He also does not have a large appetite. His daughter purchased him Ensure to try but it did not effect his blood sugars in a positive way    Expected Outcome  He will either try drinking a low-glycemic nutritional shake 1-2x/day consistently, or add in a snack in the evenings to help facilitate weight gain. Long term goal: 5-10# wt gain      Personal Goal #2 Re-Evaluation   Personal Goal #2  Eat at least one additional serving of fruit per week      Personal Goal #3 Re-Evaluation   Personal Goal #3  Before coming to class for exercise, try to consume something small such as a granola bar rather  than nothing       Psychosocial: Target Goals: Acknowledge presence or absence of significant depression and/or stress, maximize coping skills, provide positive support system. Participant is able to verbalize types and ability to use techniques and skills needed for reducing stress and depression.   Initial Review & Psychosocial Screening: Initial Psych Review & Screening - 12/14/17 1353      Initial Review   Current issues with  None Identified      Family Dynamics   Good Support System?  Yes   daughter-Michelle     Barriers   Psychosocial barriers to participate in program  There are no identifiable barriers or psychosocial needs.;The patient should benefit from training in stress management and relaxation.      Screening Interventions   Interventions  Encouraged to exercise;To provide support and resources with identified psychosocial needs;Provide feedback about the scores to participant    Expected Outcomes  Short Term goal: Utilizing psychosocial counselor, staff and physician to assist with identification of specific Stressors or current issues interfering with healing process. Setting desired goal for each stressor or current issue identified.;Long Term Goal: Stressors or current issues are controlled or eliminated.;Short Term goal: Identification and review with participant of any Quality of Life or Depression concerns found by scoring the questionnaire.;Long Term goal: The participant improves quality of Life  and PHQ9 Scores as seen by post scores and/or verbalization of changes       Quality of Life Scores:  Quality of Life - 12/14/17 1355      Quality of Life   Select  Quality of Life      Quality of Life Scores   Health/Function Pre  25.61 %    Socioeconomic Pre  23.57 %    Psych/Spiritual Pre  23.57 %    Family Pre  25.63 %    GLOBAL Pre  24.72 %      Scores of 19 and below usually indicate a poorer quality of life in these areas.  A difference of  2-3 points is  a clinically meaningful difference.  A difference of 2-3 points in the total score of the Quality of Life Index has been associated with significant improvement in overall quality of life, self-image, physical symptoms, and general health in studies assessing change in quality of life.  PHQ-9: Recent Review Flowsheet Data    Depression screen [facility] 2/9 12/14/2017   Decreased Interest 0   Down, Depressed, Hopeless 0   PHQ - 2 Score 0   Altered sleeping 0   Tired, decreased energy 0   Change in appetite 0   Feeling bad or failure about yourself  0   Trouble concentrating 0   Moving slowly or fidgety/restless 0   Suicidal thoughts 0   PHQ-9 Score 0   Difficult doing work/chores Not difficult at all     Interpretation of Total Score  Total Score Depression Severity:  1-4 = Minimal depression, 5-9 = Mild depression, 10-14 = Moderate depression, 15-19 = Moderately severe depression, 20-27 = Severe depression   Psychosocial Evaluation and Intervention: Psychosocial Evaluation - 12/29/17 0952      Psychosocial Evaluation & Interventions   Interventions  Stress management education;Encouraged to exercise with the program and follow exercise prescription    Comments  Counselor met with Mr. Holdren [name]) today for initial psychosocial evaluation.  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  He is also a diabetic.  [name] has a strong support system with (2) adult children and active involvement in his local church.  He sleeps well and has a good appetite.  [name] denies a history of depression or anxiety and is typically in a positive mood.  He has minimal stress in his life other than his daughter who works long hours in her business - and realizes he has little control over this.  [name] has goals to increase his stamina and strength while in this program to get back to walking 3 miles per day like he has in the past.  He states others encourage him to gain weight but this is  difficult with all the medications he is taking currently.  Staff will follow with him.    Expected Outcomes  Short:  [name] will practice stress management strategies - including exercise while in this program.  Long:  [name] will develop a routine of positive coping strategies for his health and his mental health.     Continue Psychosocial Services   Follow up required by staff       Psychosocial Re-Evaluation:   Psychosocial Discharge (Final Psychosocial Re-Evaluation):   Vocational Rehabilitation: Provide vocational rehab assistance to qualifying candidates.   Vocational Rehab Evaluation & Intervention: Vocational Rehab - 12/14/17 1357      Initial Vocational Rehab Evaluation & Intervention   Assessment shows need  for Vocational Rehabilitation  No       Education: Education Goals: Education classes will be provided on a variety of topics geared toward better understanding of heart health and risk factor modification. Participant will state understanding/return demonstration of topics presented as noted by education test scores.  Learning Barriers/Preferences: Learning Barriers/Preferences - 12/14/17 1356      Learning Barriers/Preferences   Learning Barriers  None    Learning Preferences  None       Education Topics:  AED/CPR: - Group verbal and written instruction with the use of models to demonstrate the basic use of the AED with the basic ABC's of resuscitation.   General Nutrition Guidelines/Fats and Fiber: -Group instruction provided by verbal, written material, models and posters to present the general guidelines for heart healthy nutrition. Gives an explanation and review of dietary fats and fiber.   Controlling Sodium/Reading Food Labels: -Group verbal and written material supporting the discussion of sodium use in heart healthy nutrition. Review and explanation with models, verbal and written materials for utilization of the food label.   Exercise  Physiology & General Exercise Guidelines: - Group verbal and written instruction with models to review the exercise physiology of the cardiovascular system and associated critical values. Provides general exercise guidelines with specific guidelines to those with heart or lung disease.    Aerobic Exercise & Resistance Training: - Gives group verbal and written instruction on the various components of exercise. Focuses on aerobic and resistive training programs and the benefits of this training and how to safely progress through these programs..   Flexibility, Balance, Mind/Body Relaxation: Provides group verbal/written instruction on the benefits of flexibility and balance training, including mind/body exercise modes such as yoga, pilates and tai chi.  Demonstration and skill practice provided.   Stress and Anxiety: - Provides group verbal and written instruction about the health risks of elevated stress and causes of high stress.  Discuss the correlation between heart/lung disease and anxiety and treatment options. Review healthy ways to manage with stress and anxiety.   Depression: - Provides group verbal and written instruction on the correlation between heart/lung disease and depressed mood, treatment options, and the stigmas associated with seeking treatment.   Cardiac Rehab from 01/07/2018 in [facility] Cardiac and Pulmonary Rehab  Date  12/24/17  Educator  [facility]  Instruction Review Code  1- Verbalizes Understanding      Anatomy & Physiology of the Heart: - Group verbal and written instruction and models provide basic cardiac anatomy and physiology, with the coronary electrical and arterial systems. Review of Valvular disease and Heart Failure   Cardiac Rehab from 01/07/2018 in [facility] Cardiac and Pulmonary Rehab  Date  12/31/17  Educator  CE  Instruction Review Code  1- Verbalizes Understanding      Cardiac Procedures: - Group verbal and written instruction to review commonly prescribed  medications for heart disease. Reviews the medication, class of the drug, and side effects. Includes the steps to properly store meds and maintain the prescription regimen. (beta blockers and nitrates)   Cardiac Rehab from 01/07/2018 in [facility] Cardiac and Pulmonary Rehab  Date  12/29/17  Educator  SB  Instruction Review Code  1- Verbalizes Understanding      Cardiac Medications I: - Group verbal and written instruction to review commonly prescribed medications for heart disease. Reviews the medication, class of the drug, and side effects. Includes the steps to properly store meds and maintain the prescription regimen.   Cardiac Rehab from 01/07/2018 in  [facility] Cardiac and Pulmonary Rehab  Date  01/05/18  Educator  KS  Instruction Review Code  1- Verbalizes Understanding      Cardiac Medications II: -Group verbal and written instruction to review commonly prescribed medications for heart disease. Reviews the medication, class of the drug, and side effects. (all other drug classes)   Cardiac Rehab from 01/07/2018 in [facility] Cardiac and Pulmonary Rehab  Date  12/22/17  Educator  SB  Instruction Review Code  1- Verbalizes Understanding       Go Sex-Intimacy & Heart Disease, Get SMART - Goal Setting: - Group verbal and written instruction through game format to discuss heart disease and the return to sexual intimacy. Provides group verbal and written material to discuss and apply goal setting through the application of the S.M.A.R.T. Method.   Cardiac Rehab from 01/07/2018 in [facility] Cardiac and Pulmonary Rehab  Date  12/29/17  Educator  SB  Instruction Review Code  1- Verbalizes Understanding      Other Matters of the Heart: - Provides group verbal, written materials and models to describe Stable Angina and Peripheral Artery. Includes description of the disease process and treatment options available to the cardiac patient.   Cardiac Rehab from 01/07/2018 in [facility] Cardiac and Pulmonary Rehab   Date  12/31/17  Educator  CE  Instruction Review Code  1- Verbalizes Understanding      Exercise & Equipment Safety: - Individual verbal instruction and demonstration of equipment use and safety with use of the equipment.   Cardiac Rehab from 01/07/2018 in [facility] Cardiac and Pulmonary Rehab  Date  12/14/17  Educator  [facility]  Instruction Review Code  1- Verbalizes Understanding      Infection Prevention: - Provides verbal and written material to individual with discussion of infection control including proper hand washing and proper equipment cleaning during exercise session.   Falls Prevention: - Provides verbal and written material to individual with discussion of falls prevention and safety.   Cardiac Rehab from 01/07/2018 in [facility] Cardiac and Pulmonary Rehab  Date  12/14/17  Educator  [facility]  Instruction Review Code  1- Verbalizes Understanding      Diabetes: - Individual verbal and written instruction to review signs/symptoms of diabetes, desired ranges of glucose level fasting, after meals and with exercise. Acknowledge that pre and post exercise glucose checks will be done for 3 sessions at entry of program.   Cardiac Rehab from 01/07/2018 in [facility] Cardiac and Pulmonary Rehab  Date  12/14/17  Educator  SB  Instruction Review Code  1- Verbalizes Understanding      Know Your Numbers and Risk Factors: -Group verbal and written instruction about important numbers in your health.  Discussion of what are risk factors and how they play a role in the disease process.  Review of Cholesterol, Blood Pressure, Diabetes, and BMI and the role they play in your overall health.   Cardiac Rehab from 01/07/2018 in [facility] Cardiac and Pulmonary Rehab  Date  12/22/17  Educator  SB  Instruction Review Code  1- Verbalizes Understanding      Sleep Hygiene: -Provides group verbal and written instruction about how sleep can affect your health.  Define sleep hygiene, discuss sleep cycles and impact of  sleep habits. Review good sleep hygiene tips.    Other: -Provides group and verbal instruction on various topics (see comments)   Knowledge Questionnaire Score: Knowledge Questionnaire Score - 12/14/17 1356      Knowledge Questionnaire Score   Pre Score  22/26   Reviewed correct responses with [name] today. He verbalized understanding of the answers and had no further questions today.      Core Components/Risk Factors/Patient Goals at Admission: Personal Goals and Risk Factors at Admission - 12/14/17 1357      Core Components/Risk Factors/Patient Goals on Admission    Weight Management  Yes;Weight Gain    Intervention  Weight Management: Develop a combined nutrition and exercise program designed to reach desired caloric intake, while maintaining appropriate intake of nutrient and fiber, sodium and fats, and appropriate energy expenditure required for the weight goal.;Weight Management: Provide education and appropriate resources to help participant work on and attain dietary goals.    Admit Weight  158 lb 1.6 oz (71.7 kg)    Goal Weight: Short Term  160 lb (72.6 kg)    Goal Weight: Long Term  165 lb (74.8 kg)    Expected Outcomes  Short Term: Continue to assess and modify interventions until short term weight is achieved;Long Term: Adherence to nutrition and physical activity/exercise program aimed toward attainment of established weight goal;Weight Gain: Understanding of general recommendations for a high calorie, high protein meal plan that promotes weight gain by distributing calorie intake throughout the day with the consumption for 4-5 meals, snacks, and/or supplements   [name] has lost about 20 lbs and his doctor has asked him to gain some weight.   Tobacco Cessation  Yes   Reviewed steps toward quitting with [name] today. He does not state that he is ready to quit.    Number of packs per day  uses tobacco pouches about 3  a day    Intervention  Assist the participant in steps to  quit. Provide individualized education and counseling about committing to Tobacco Cessation, relapse prevention, and pharmacological support that can be provided by physician.;Advice worker, assist with locating and accessing local/national Quit Smoking programs, and support quit date choice.    Expected Outcomes  Short Term: Will demonstrate readiness to quit, by selecting a quit date.;Short Term: Will quit all tobacco product use, adhering to prevention of relapse plan.;Long Term: Complete abstinence from all tobacco products for at least 12 months from quit date.    Diabetes  Yes    Intervention  Provide education about signs/symptoms and action to take for hypo/hyperglycemia.;Provide education about proper nutrition, including hydration, and aerobic/resistive exercise prescription along with prescribed medications to achieve blood glucose in normal ranges: Fasting glucose 65-99 mg/dL    Expected Outcomes  Short Term: Participant verbalizes understanding of the signs/symptoms and immediate care of hyper/hypoglycemia, proper foot care and importance of medication, aerobic/resistive exercise and nutrition plan for blood glucose control.;Long Term: Attainment of HbA1C < 7%.    Heart Failure  Yes   [name] does not have scales at home.  Will check to see if we can get him some scales.    Intervention  Provide a combined exercise and nutrition program that is supplemented with education, support and counseling about heart failure. Directed toward relieving symptoms such as shortness of breath, decreased exercise tolerance, and extremity edema.    Expected Outcomes  Improve functional capacity of life;Short term: Attendance in program 2-3 days a week with increased exercise capacity. Reported lower sodium intake. Reported increased fruit and vegetable intake. Reports medication compliance.;Short term: Daily weights obtained and reported for increase. Utilizing diuretic protocols set by  physician.;Long term: Adoption of self-care skills and reduction of barriers for early signs and symptoms recognition and intervention leading to self-care  maintenance.    Lipids  Yes    Intervention  Provide education and support for participant on nutrition & aerobic/resistive exercise along with prescribed medications to achieve LDL <58m, HDL >432m    Expected Outcomes  Short Term: Participant states understanding of desired cholesterol values and is compliant with medications prescribed. Participant is following exercise prescription and nutrition guidelines.;Long Term: Cholesterol controlled with medications as prescribed, with individualized exercise RX and with personalized nutrition plan. Value goals: LDL < 7076mHDL > 40 mg.       Core Components/Risk Factors/Patient Goals Review:  Goals and Risk Factor Review    Row Name 12/31/17 0809             Core Components/Risk Factors/Patient Goals Review   Personal Goals Review  Weight Management/Obesity;Tobacco Cessation;Hypertension;Diabetes;Lipids       Review  Frank's weight has been staying steady.  He continues to slowly cut back on his tobacco use. He is trying to cut back. His blood sugars are improving and he checks them twice a day.  His blood pressures are doing good in class.  He does not check it at home as he does not have a cuff at home.  He is doing well with his medication and tolerating the  entresto fairly well.        Expected Outcomes  Short: Continue to work on [facility] [employer]; Continue to monitor risk factors.            Core Components/Risk Factors/Patient Goals at Discharge (Final Review):  Goals and Risk Factor Review - 12/31/17 0809      Core Components/Risk Factors/Patient Goals Review   Personal Goals Review  Weight Management/Obesity;Tobacco Cessation;Hypertension;Diabetes;Lipids    Review  Frank's weight has been staying steady.  He continues to slowly cut back on his tobacco use. He is trying to  cut back. His blood sugars are improving and he checks them twice a day.  His blood pressures are doing good in class.  He does not check it at home as he does not have a cuff at home.  He is doing well with his medication and tolerating the  entresto fairly well.     Expected Outcomes  Short: Continue to work on [employer]; Continue to monitor risk factors.         ITP Comments: ITP Comments    Row Name 12/14/17 1337 12/23/17 0556         ITP Comments  Medical evaluation completed today. ITP sent to Dr M MLoleta Chancer review,changes as needed and signature. [facility]  [facility]. Continue with ITP unless direccted changes per Medical Director Chart Review. New to  program         Comments: I sent a fax to Dr. DevMosetta Pigeon Covering [facility] on 01/14/2018 with multiple PVC's while exericising that FleCasyd not feel. MD faxed back and checked the box "Continue with program protocols".

## 2018-01-19 ENCOUNTER — Encounter: Payer: Self-pay | Admitting: *Deleted

## 2018-01-19 DIAGNOSIS — I5022 Chronic systolic (congestive) heart failure: Secondary | ICD-10-CM

## 2018-01-19 NOTE — Progress Notes (Signed)
Cardiac Individual Treatment Plan  Patient Details  Name: Carl Chen MRN: [id number] Date of Birth: 1940-07-01 Referring Provider:     Cardiac Rehab from 12/14/2017 in [facility] Cardiac and Pulmonary Rehab  Referring Provider  [name] MD      Initial Encounter Date:    Cardiac Rehab from 12/14/2017 in [facility] Cardiac and Pulmonary Rehab  Date  12/14/17      Visit Diagnosis: Heart failure, chronic systolic ([place])  Patient's Home Medications on Admission:  Current Outpatient Medications:  .  amLODipine (NORVASC) 5 MG tablet, , Disp: , Rfl:  .  amLODipine-olmesartan (AZOR) 5-20 MG per tablet, Take 1 tablet by mouth daily., Disp: , Rfl:  .  Cinnamon 500 MG capsule, Take by mouth., Disp: , Rfl:  .  ENTRESTO 24-26 MG, , Disp: , Rfl:  .  glipiZIDE (GLUCOTROL XL) 5 MG 24 hr tablet, Take 5 mg by mouth daily with breakfast., Disp: , Rfl:  .  Insulin Isophane & Regular Human (HUMULIN 70/30 KWIKPEN) (70-30) 100 UNIT/ML PEN, Inject into the skin., Disp: , Rfl:  .  Investigational - Study Medication, Take 35 mg by mouth daily. Take o each by mouth Daily before dinner 35 mg empaglifozin MVH/QI69 mg placebo. Study is being conducted by [place] ,[place], Disp: , Rfl:  .  losartan (COZAAR) 50 MG tablet, Take 50 mg by mouth daily., Disp: , Rfl:  .  magnesium oxide (MAG-OX) 400 MG tablet, Take by mouth., Disp: , Rfl:  .  metFORMIN (GLUCOPHAGE) 1000 MG tablet, Take 1,000 mg by mouth 2 (two) times daily with a meal., Disp: , Rfl:  .  metoprolol succinate (TOPROL-XL) 25 MG 24 hr tablet, Take by mouth., Disp: , Rfl:  .  nystatin-triamcinolone ointment (MYCOLOG), Apply 1 application topically 2 (two) times daily., Disp: 30 g, Rfl: 0 .  simvastatin (ZOCOR) 20 MG tablet, Take 20 mg by mouth daily., Disp: , Rfl:  .  Urea (URAMAXIN EX), Apply topically., Disp: , Rfl:  .  vitamin B-12 (CYANOCOBALAMIN) 500 MCG tablet, Take by mouth., Disp: , Rfl:   Past Medical History: Past Medical History:   Diagnosis Date  . Diabetes ([place])   . Hypertension     Tobacco Use: Social History   Tobacco Use  Smoking Status Former Smoker  . Packs/day: 3.00  . Years: 60.00  . Pack years: 180.00  Smokeless Tobacco Current User  . Types: Chew  Tobacco Comment   12/12  Down to three pouches a day.    Labs: Recent Review Flowsheet Data    There is no flowsheet data to display.       Exercise Target Goals: Exercise Program Goal: Individual exercise prescription set using results from initial 6 min walk test and THRR while considering  patient's activity barriers and safety.   Exercise Prescription Goal: Initial exercise prescription builds to 30-45 minutes a day of aerobic activity, 2-3 days per week.  Home exercise guidelines will be given to patient during program as part of exercise prescription that the participant will acknowledge.  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 Has had cortisone shots in the past   Cardiac Risk Stratification  High       6 Minute Walk: 6 Minute Walk    Row Name 12/14/17 1411  6 Minute Walk   Phase  Initial     Distance  1360 feet     Walk Time  6 minutes     # of Rest Breaks  0     MPH  2.58     METS  3.05     RPE  9     VO2 Peak  10.67     Symptoms  No     Resting HR  76 bpm     Resting BP  126/74     Resting Oxygen Saturation   97 %     Exercise Oxygen Saturation  during 6 min walk  96 %     Max Ex. HR  80 bpm     Max Ex. BP  146/70     2 Minute Post BP  126/62        Oxygen Initial Assessment:   Oxygen Re-Evaluation:   Oxygen Discharge (Final Oxygen Re-Evaluation):   Initial Exercise Prescription: Initial Exercise Prescription - 12/14/17 1400      Date of Initial  Exercise RX and Referring Provider   Date  12/14/17    Referring Provider  [name] MD      Treadmill   MPH  2.5    Grade  0.5    Minutes  15    METs  3.09      NuStep   Level  3    SPM  80    Minutes  15    METs  3      REL-XR   Level  2    Speed  50    Minutes  15    METs  3      Prescription Details   Frequency (times per week)  2    Duration  Progress to 30 minutes of continuous aerobic without signs/symptoms of physical distress      Intensity   THRR 40-80% of Max Heartrate  104-131    Ratings of Perceived Exertion  11-13    Perceived Dyspnea  0-4      Progression   Progression  Continue to progress workloads to maintain intensity without signs/symptoms of physical distress.      Resistance Training   Training Prescription  Yes    Weight  4 lbs    Reps  10-15       Perform Capillary Blood Glucose checks as needed.  Exercise Prescription Changes: Exercise Prescription Changes    Row Name 12/14/17 1300 12/24/17 0800 12/29/17 0900 01/11/18 1600       Response to Exercise   Blood Pressure (Admit)  126/74  -  122/74  120/62    Blood Pressure (Exercise)  146/70  -  136/70  134/70    Blood Pressure (Exit)  126/62  -  122/74  128/70    Heart Rate (Admit)  76 bpm  -  66 bpm  80 bpm    Heart Rate (Exercise)  80 bpm  -  104 bpm  103 bpm    Heart Rate (Exit)  75 bpm  -  81 bpm  97 bpm    Oxygen Saturation (Admit)  97 %  -  -  -    Oxygen Saturation (Exercise)  96 %  -  -  -    Rating of Perceived Exertion (Exercise)  9  -  11  9    Symptoms  none  -  none  none    Comments  walk test  results  -  -  -    Duration  -  -  Continue with 30 min of aerobic exercise without signs/symptoms of physical distress.  Continue with 30 min of aerobic exercise without signs/symptoms of physical distress.    Intensity  -  -  THRR unchanged  THRR unchanged      Progression   Progression  -  -  Continue to progress workloads to maintain intensity without signs/symptoms of  physical distress.  Continue to progress workloads to maintain intensity without signs/symptoms of physical distress.    Average METs  -  -  3.03  3.63      Resistance Training   Training Prescription  -  -  Yes  Yes    Weight  -  -  4 lbs  4 lbs    Reps  -  -  10-15  10-15      Interval Training   Interval Training  -  -  No  No      Treadmill   MPH  -  -  2.5  3    Grade  -  -  0.5  0.5    Minutes  -  -  15  15    METs  -  -  3.09  3.5      NuStep   Level  -  -  4  4    Minutes  -  -  15  15    METs  -  -  3  2.9      REL-XR   Level  -  -  2  5    Minutes  -  -  15  15    METs  -  -  3  4.5      Home Exercise Plan   Plans to continue exercise at  -  Home (comment) walk  Home (comment) walk  Home (comment) walk    Frequency  -  Add 3 additional days to program exercise sessions.  Add 3 additional days to program exercise sessions.  Add 3 additional days to program exercise sessions.    Initial Home Exercises Provided  -  12/24/17  12/24/17  12/24/17       Exercise Comments: Exercise Comments    Row Name 12/22/17 0809 12/24/17 0837         Exercise Comments   First full day of exercise!  Patient was oriented to gym and equipment including functions, settings, policies, and procedures.  Patient's individual exercise prescription and treatment plan were reviewed.  All starting workloads were established based on the results of the 6 minute walk test done at initial orientation visit.  The plan for exercise progression was also introduced and progression will be customized based on patient's performance and goals.  Reviewed home exercise with pt today.  Pt plans to walk for exercise.  Reviewed THR, pulse, RPE, sign and symptoms, NTG use, and when to call 911 or MD.  Also discussed weather considerations and indoor options.  Pt voiced understanding.         Exercise Goals and Review: Exercise Goals    Row Name 12/14/17 1414             Exercise Goals   Increase  Physical Activity  Yes       Intervention  Provide advice, education, support and counseling about physical activity/exercise needs.;Develop an individualized exercise prescription for aerobic and resistive training based on  initial evaluation findings, risk stratification, comorbidities and participant's personal goals.       Expected Outcomes  Short Term: Attend rehab on a regular basis to increase amount of physical activity.;Long Term: Exercising regularly at least 3-5 days a week.;Long Term: Add in home exercise to make exercise part of routine and to increase amount of physical activity.       Increase Strength and Stamina  Yes       Intervention  Provide advice, education, support and counseling about physical activity/exercise needs.;Develop an individualized exercise prescription for aerobic and resistive training based on initial evaluation findings, risk stratification, comorbidities and participant's personal goals.       Expected Outcomes  Short Term: Increase workloads from initial exercise prescription for resistance, speed, and METs.;Short Term: Perform resistance training exercises routinely during rehab and add in resistance training at home;Long Term: Improve cardiorespiratory fitness, muscular endurance and strength as measured by increased METs and functional capacity (6MWT)       Able to understand and use rate of perceived exertion (RPE) scale  Yes       Intervention  Provide education and explanation on how to use RPE scale       Expected Outcomes  Short Term: Able to use RPE daily in rehab to express subjective intensity level;Long Term:  Able to use RPE to guide intensity level when exercising independently       Knowledge and understanding of Target Heart Rate Range (THRR)  Yes       Intervention  Provide education and explanation of THRR including how the numbers were predicted and where they are located for reference       Expected Outcomes  Short Term: Able to state/look up  THRR;Short Term: Able to use daily as guideline for intensity in rehab;Long Term: Able to use THRR to govern intensity when exercising independently       Able to check pulse independently  Yes       Intervention  Provide education and demonstration on how to check pulse in carotid and radial arteries.;Review the importance of being able to check your own pulse for safety during independent exercise       Expected Outcomes  Short Term: Able to explain why pulse checking is important during independent exercise;Long Term: Able to check pulse independently and accurately       Understanding of Exercise Prescription  Yes       Intervention  Provide education, explanation, and written materials on patient's individual exercise prescription       Expected Outcomes  Short Term: Able to explain program exercise prescription;Long Term: Able to explain home exercise prescription to exercise independently          Exercise Goals Re-Evaluation : Exercise Goals Re-Evaluation    Row Name 12/22/17 0810 12/24/17 0837 12/29/17 0943 12/31/17 0807 01/11/18 1619     Exercise Goal Re-Evaluation   Exercise Goals Review  Increase Physical Activity;Increase Strength and Stamina;Able to understand and use rate of perceived exertion (RPE) scale;Knowledge and understanding of Target Heart Rate Range (THRR);Understanding of Exercise Prescription  Increase Physical Activity;Increase Strength and Stamina;Able to understand and use rate of perceived exertion (RPE) scale;Knowledge and understanding of Target Heart Rate Range (THRR);Able to check pulse independently;Understanding of Exercise Prescription  Increase Physical Activity;Increase Strength and Stamina;Able to understand and use rate of perceived exertion (RPE) scale;Knowledge and understanding of Target Heart Rate Range (THRR);Able to check pulse independently;Understanding of Exercise Prescription  Increase Physical Activity;Increase Strength and  Stamina;Understanding of  Exercise Prescription  Increase Physical Activity;Increase Strength and Stamina;Understanding of Exercise Prescription   Comments  Reviewed RPE scale, THR and program prescription with pt today.  Pt voiced understanding and was given a copy of goals to take home.   Carl Chen already walks for exercise on days not at [facility].  He will check HR and add some stretching and strength work to walking.  Reviewed home exercise with pt today.  Pt plans to walk at home for exercise.  Reviewed THR, pulse, RPE, sign and symptoms, NTG use, and when to call 911 or MD.  Also discussed weather considerations and indoor options.  Pt voiced understanding.  Carl Chen is doing well in rehab.  He has enjoyed coming and has not been getting out with the the cold as much. He is already interested in [employer].  He is starting to notice that his strength and stamina are coming back.   Cecille Rubin continues to do well in rehab.  He is up to level 5 on the XR and 3.0 mph on the treadmill.  We will continue to monitor his progression.    Expected Outcomes  Short: Use RPE daily to regulate intensity. Long: Follow program prescription in THR.  Short - check HR while walking and add strength work Long - mainain exercise on his own  Short: Start walking more at home. Long: Continue to exercise indpendently  Short: Continue to attend rehab regularly.  Long: Continue to increase strength and stamina.   Short: Continue to increase workloads to reach RPE of 11-13.  Long: Continue to exercise on off days.       Discharge Exercise Prescription (Final Exercise Prescription Changes): Exercise Prescription Changes - 01/11/18 1600      Response to Exercise   Blood Pressure (Admit)  120/62    Blood Pressure (Exercise)  134/70    Blood Pressure (Exit)  128/70    Heart Rate (Admit)  80 bpm    Heart Rate (Exercise)  103 bpm    Heart Rate (Exit)  97 bpm    Rating of Perceived Exertion (Exercise)  9    Symptoms  none    Duration  Continue with 30 min of  aerobic exercise without signs/symptoms of physical distress.    Intensity  THRR unchanged      Progression   Progression  Continue to progress workloads to maintain intensity without signs/symptoms of physical distress.    Average METs  3.63      Resistance Training   Training Prescription  Yes    Weight  4 lbs    Reps  10-15      Interval Training   Interval Training  No      Treadmill   MPH  3    Grade  0.5    Minutes  15    METs  3.5      NuStep   Level  4    Minutes  15    METs  2.9      REL-XR   Level  5    Minutes  15    METs  4.5      Home Exercise Plan   Plans to continue exercise at  Home (comment)   walk   Frequency  Add 3 additional days to program exercise sessions.    Initial Home Exercises Provided  12/24/17       Nutrition:  Target Goals: Understanding of nutrition guidelines, daily intake of sodium <1572m, cholesterol <2071m  calories 30% from fat and 7% or less from saturated fats, daily to have 5 or more servings of fruits and vegetables.  Biometrics: Pre Biometrics - 12/14/17 1415      Pre Biometrics   Height  5' 11.1" (1.806 m)    Weight  158 lb 1.6 oz (71.7 kg)    Waist Circumference  32 inches    Hip Circumference  38 inches    Waist to Hip Ratio  0.84 %    BMI (Calculated)  21.99    Single Leg Stand  15.3 seconds        Nutrition Therapy Plan and Nutrition Goals: Nutrition Therapy & Goals - 12/22/17 0857      Nutrition Therapy   Diet  DM    Protein (specify units)  12oz    Fiber  30 grams    Whole Grain Foods  3 servings   does not currently choose whole grain options   Saturated Fats  16 max. grams    Fruits and Vegetables  5 servings/day   8 ideal; likely not meeting recommended daily intake of fruits, eats mostly starchy vegetables   Sodium  1500 grams      Personal Nutrition Goals   Nutrition Goal  Eat at least one additional serving of fruit per week    Personal Goal #2  Because you desire weight gain but do not  typically eat 3 meals/day or eat large portions at meal times, it may be helpful to be more consistent about drinking 1-2 Atkins/ Boost/ Glucerna shakes daily, and/or adding in an evening snack regularly    Personal Goal #3  Before coming to class for exercise, try to consume something small such as a granola bar rather than nothing    Comments  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 He reads labels to choose items that are low in sugar and sodium and tries to eat "lots of greens." Drinks sugar free beverages as well. He does not typically eat breakfast and may have a small dinner; lunch is his main meal. He will occasionall eat a granola bar or drink an Atkins shake in the mornings. Lunch/ Dinner: burger + fries, salad, sandwich. Does not snack often but typically chooses PB crackers if so. Eats canned soups regularly. He does not choose whole wheat products at this time. Dairy foods consumed: cheese.       Intervention Plan   Intervention  Prescribe, educate and counsel regarding individualized specific dietary modifications aiming towards targeted core components such as weight, hypertension, lipid management, diabetes, heart failure and other comorbidities.    Expected Outcomes  Short Term Goal: Understand basic principles of dietary content, such as calories, fat, sodium, cholesterol and nutrients.;Short Term Goal: A plan has been developed with personal nutrition goals set during dietitian appointment.;Long Term Goal: Adherence to prescribed nutrition plan.       Nutrition Assessments: Nutrition Assessments - 12/14/17 1356      MEDFICTS Scores   Pre Score  6       Nutrition Goals Re-Evaluation: Nutrition Goals Re-Evaluation    Row Name 12/22/17 0908             Goals   Nutrition Goal  Because you desire weight gain but do not typically eat 3 meals/day or eat large portions at meal times, it may be helpful to be more consistent about drinking 1-2 Atkins/ Boost/ Glucerna  shakes daily, and/or adding in an evening snack regularly  Comment  He has been advised by his physician to gain some weight, but finds this challenging while taking Metformin. He also does not have a large appetite. His daughter purchased him Ensure to try but it did not effect his blood sugars in a positive way       Expected Outcome  He will either try drinking a low-glycemic nutritional shake 1-2x/day consistently, or add in a snack in the evenings to help facilitate weight gain. Long term goal: 5-10# wt gain         Personal Goal #2 Re-Evaluation   Personal Goal #2  Eat at least one additional serving of fruit per week         Personal Goal #3 Re-Evaluation   Personal Goal #3  Before coming to class for exercise, try to consume something small such as a granola bar rather than nothing          Nutrition Goals Discharge (Final Nutrition Goals Re-Evaluation): Nutrition Goals Re-Evaluation - 12/22/17 0908      Goals   Nutrition Goal  Because you desire weight gain but do not typically eat 3 meals/day or eat large portions at meal times, it may be helpful to be more consistent about drinking 1-2 Atkins/ Boost/ Glucerna shakes daily, and/or adding in an evening snack regularly    Comment  He has been advised by his physician to gain some weight, but finds this challenging while taking Metformin. He also does not have a large appetite. His daughter purchased him Ensure to try but it did not effect his blood sugars in a positive way    Expected Outcome  He will either try drinking a low-glycemic nutritional shake 1-2x/day consistently, or add in a snack in the evenings to help facilitate weight gain. Long term goal: 5-10# wt gain      Personal Goal #2 Re-Evaluation   Personal Goal #2  Eat at least one additional serving of fruit per week      Personal Goal #3 Re-Evaluation   Personal Goal #3  Before coming to class for exercise, try to consume something small such as a granola bar rather  than nothing       Psychosocial: Target Goals: Acknowledge presence or absence of significant depression and/or stress, maximize coping skills, provide positive support system. Participant is able to verbalize types and ability to use techniques and skills needed for reducing stress and depression.   Initial Review & Psychosocial Screening: Initial Psych Review & Screening - 12/14/17 1353      Initial Review   Current issues with  None Identified      Family Dynamics   Good Support System?  Yes   daughter-Michelle     Barriers   Psychosocial barriers to participate in program  There are no identifiable barriers or psychosocial needs.;The patient should benefit from training in stress management and relaxation.      Screening Interventions   Interventions  Encouraged to exercise;To provide support and resources with identified psychosocial needs;Provide feedback about the scores to participant    Expected Outcomes  Short Term goal: Utilizing psychosocial counselor, staff and physician to assist with identification of specific Stressors or current issues interfering with healing process. Setting desired goal for each stressor or current issue identified.;Long Term Goal: Stressors or current issues are controlled or eliminated.;Short Term goal: Identification and review with participant of any Quality of Life or Depression concerns found by scoring the questionnaire.;Long Term goal: The participant improves quality of Life  and PHQ9 Scores as seen by post scores and/or verbalization of changes       Quality of Life Scores:  Quality of Life - 12/14/17 1355      Quality of Life   Select  Quality of Life      Quality of Life Scores   Health/Function Pre  25.61 %    Socioeconomic Pre  23.57 %    Psych/Spiritual Pre  23.57 %    Family Pre  25.63 %    GLOBAL Pre  24.72 %      Scores of 19 and below usually indicate a poorer quality of life in these areas.  A difference of  2-3 points is  a clinically meaningful difference.  A difference of 2-3 points in the total score of the Quality of Life Index has been associated with significant improvement in overall quality of life, self-image, physical symptoms, and general health in studies assessing change in quality of life.  PHQ-9: Recent Review Flowsheet Data    Depression screen [facility] 2/9 12/14/2017   Decreased Interest 0   Down, Depressed, Hopeless 0   PHQ - 2 Score 0   Altered sleeping 0   Tired, decreased energy 0   Change in appetite 0   Feeling bad or failure about yourself  0   Trouble concentrating 0   Moving slowly or fidgety/restless 0   Suicidal thoughts 0   PHQ-9 Score 0   Difficult doing work/chores Not difficult at all     Interpretation of Total Score  Total Score Depression Severity:  1-4 = Minimal depression, 5-9 = Mild depression, 10-14 = Moderate depression, 15-19 = Moderately severe depression, 20-27 = Severe depression   Psychosocial Evaluation and Intervention: Psychosocial Evaluation - 12/29/17 0952      Psychosocial Evaluation & Interventions   Interventions  Stress management education;Encouraged to exercise with the program and follow exercise prescription    Comments  Counselor met with Carl Chen) today for initial psychosocial evaluation.  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  He is also a diabetic.  Carl Chen has a strong support system with (2) adult children and active involvement in his local church.  He sleeps well and has a good appetite.  Carl Chen denies a history of depression or anxiety and is typically in a positive mood.  He has minimal stress in his life other than his daughter who works long hours in her business - and realizes he has little control over this.  Carl Chen has goals to increase his stamina and strength while in this program to get back to walking 3 miles per day like he has in the past.  He states others encourage him to gain weight but this is  difficult with all the medications he is taking currently.  Staff will follow with him.    Expected Outcomes  Short:  Carl Chen will practice stress management strategies - including exercise while in this program.  Long:  Carl Chen will develop a routine of positive coping strategies for his health and his mental health.     Continue Psychosocial Services   Follow up required by staff       Psychosocial Re-Evaluation:   Psychosocial Discharge (Final Psychosocial Re-Evaluation):   Vocational Rehabilitation: Provide vocational rehab assistance to qualifying candidates.   Vocational Rehab Evaluation & Intervention: Vocational Rehab - 12/14/17 1357      Initial Vocational Rehab Evaluation & Intervention   Assessment shows need  for Vocational Rehabilitation  No       Education: Education Goals: Education classes will be provided on a variety of topics geared toward better understanding of heart health and risk factor modification. Participant will state understanding/return demonstration of topics presented as noted by education test scores.  Learning Barriers/Preferences: Learning Barriers/Preferences - 12/14/17 1356      Learning Barriers/Preferences   Learning Barriers  None    Learning Preferences  None       Education Topics:  AED/CPR: - Group verbal and written instruction with the use of models to demonstrate the basic use of the AED with the basic ABC's of resuscitation.   General Nutrition Guidelines/Fats and Fiber: -Group instruction provided by verbal, written material, models and posters to present the general guidelines for heart healthy nutrition. Gives an explanation and review of dietary fats and fiber.   Controlling Sodium/Reading Food Labels: -Group verbal and written material supporting the discussion of sodium use in heart healthy nutrition. Review and explanation with models, verbal and written materials for utilization of the food label.   Exercise  Physiology & General Exercise Guidelines: - Group verbal and written instruction with models to review the exercise physiology of the cardiovascular system and associated critical values. Provides general exercise guidelines with specific guidelines to those with heart or lung disease.    Aerobic Exercise & Resistance Training: - Gives group verbal and written instruction on the various components of exercise. Focuses on aerobic and resistive training programs and the benefits of this training and how to safely progress through these programs..   Flexibility, Balance, Mind/Body Relaxation: Provides group verbal/written instruction on the benefits of flexibility and balance training, including mind/body exercise modes such as yoga, pilates and tai chi.  Demonstration and skill practice provided.   Stress and Anxiety: - Provides group verbal and written instruction about the health risks of elevated stress and causes of high stress.  Discuss the correlation between heart/lung disease and anxiety and treatment options. Review healthy ways to manage with stress and anxiety.   Depression: - Provides group verbal and written instruction on the correlation between heart/lung disease and depressed mood, treatment options, and the stigmas associated with seeking treatment.   Cardiac Rehab from 01/07/2018 in [facility] Cardiac and Pulmonary Rehab  Date  12/24/17  Educator  [facility]  Instruction Review Code  1- Verbalizes Understanding      Anatomy & Physiology of the Heart: - Group verbal and written instruction and models provide basic cardiac anatomy and physiology, with the coronary electrical and arterial systems. Review of Valvular disease and Heart Failure   Cardiac Rehab from 01/07/2018 in [facility] Cardiac and Pulmonary Rehab  Date  12/31/17  Educator  CE  Instruction Review Code  1- Verbalizes Understanding      Cardiac Procedures: - Group verbal and written instruction to review commonly prescribed  medications for heart disease. Reviews the medication, class of the drug, and side effects. Includes the steps to properly store meds and maintain the prescription regimen. (beta blockers and nitrates)   Cardiac Rehab from 01/07/2018 in [facility] Cardiac and Pulmonary Rehab  Date  12/29/17  Educator  SB  Instruction Review Code  1- Verbalizes Understanding      Cardiac Medications I: - Group verbal and written instruction to review commonly prescribed medications for heart disease. Reviews the medication, class of the drug, and side effects. Includes the steps to properly store meds and maintain the prescription regimen.   Cardiac Rehab from 01/07/2018 in  [place] Cardiac and Pulmonary Rehab  Date  01/05/18  Educator  KS  Instruction Review Code  1- Verbalizes Understanding      Cardiac Medications II: -Group verbal and written instruction to review commonly prescribed medications for heart disease. Reviews the medication, class of the drug, and side effects. (all other drug classes)   Cardiac Rehab from 01/07/2018 in [facility] Cardiac and Pulmonary Rehab  Date  12/22/17  Educator  SB  Instruction Review Code  1- Verbalizes Understanding       Go Sex-Intimacy & Heart Disease, Get SMART - Goal Setting: - Group verbal and written instruction through game format to discuss heart disease and the return to sexual intimacy. Provides group verbal and written material to discuss and apply goal setting through the application of the S.M.A.R.T. Method.   Cardiac Rehab from 01/07/2018 in [facility] Cardiac and Pulmonary Rehab  Date  12/29/17  Educator  SB  Instruction Review Code  1- Verbalizes Understanding      Other Matters of the Heart: - Provides group verbal, written materials and models to describe Stable Angina and Peripheral Artery. Includes description of the disease process and treatment options available to the cardiac patient.   Cardiac Rehab from 01/07/2018 in [facility] Cardiac and Pulmonary Rehab   Date  12/31/17  Educator  CE  Instruction Review Code  1- Verbalizes Understanding      Exercise & Equipment Safety: - Individual verbal instruction and demonstration of equipment use and safety with use of the equipment.   Cardiac Rehab from 01/07/2018 in [facility] Cardiac and Pulmonary Rehab  Date  12/14/17  Educator  [facility]  Instruction Review Code  1- Verbalizes Understanding      Infection Prevention: - Provides verbal and written material to individual with discussion of infection control including proper hand washing and proper equipment cleaning during exercise session.   Falls Prevention: - Provides verbal and written material to individual with discussion of falls prevention and safety.   Cardiac Rehab from 01/07/2018 in [facility] Cardiac and Pulmonary Rehab  Date  12/14/17  Educator  [facility]  Instruction Review Code  1- Verbalizes Understanding      Diabetes: - Individual verbal and written instruction to review signs/symptoms of diabetes, desired ranges of glucose level fasting, after meals and with exercise. Acknowledge that pre and post exercise glucose checks will be done for 3 sessions at entry of program.   Cardiac Rehab from 01/07/2018 in [facility] Cardiac and Pulmonary Rehab  Date  12/14/17  Educator  SB  Instruction Review Code  1- Verbalizes Understanding      Know Your Numbers and Risk Factors: -Group verbal and written instruction about important numbers in your health.  Discussion of what are risk factors and how they play a role in the disease process.  Review of Cholesterol, Blood Pressure, Diabetes, and BMI and the role they play in your overall health.   Cardiac Rehab from 01/07/2018 in [facility] Cardiac and Pulmonary Rehab  Date  12/22/17  Educator  SB  Instruction Review Code  1- Verbalizes Understanding      Sleep Hygiene: -Provides group verbal and written instruction about how sleep can affect your health.  Define sleep hygiene, discuss sleep cycles and impact of  sleep habits. Review good sleep hygiene tips.    Other: -Provides group and verbal instruction on various topics (see comments)   Knowledge Questionnaire Score: Knowledge Questionnaire Score - 12/14/17 1356      Knowledge Questionnaire Score   Pre Score  22/26   Reviewed correct responses with Carl Chen today. He verbalized understanding of the answers and had no further questions today.      Core Components/Risk Factors/Patient Goals at Admission: Personal Goals and Risk Factors at Admission - 12/14/17 1357      Core Components/Risk Factors/Patient Goals on Admission    Weight Management  Yes;Weight Gain    Intervention  Weight Management: Develop a combined nutrition and exercise program designed to reach desired caloric intake, while maintaining appropriate intake of nutrient and fiber, sodium and fats, and appropriate energy expenditure required for the weight goal.;Weight Management: Provide education and appropriate resources to help participant work on and attain dietary goals.    Admit Weight  158 lb 1.6 oz (71.7 kg)    Goal Weight: Short Term  160 lb (72.6 kg)    Goal Weight: Long Term  165 lb (74.8 kg)    Expected Outcomes  Short Term: Continue to assess and modify interventions until short term weight is achieved;Long Term: Adherence to nutrition and physical activity/exercise program aimed toward attainment of established weight goal;Weight Gain: Understanding of general recommendations for a high calorie, high protein meal plan that promotes weight gain by distributing calorie intake throughout the day with the consumption for 4-5 meals, snacks, and/or supplements   Carl Chen has lost about 20 lbs and his doctor has asked him to gain some weight.   Tobacco Cessation  Yes   Reviewed steps toward quitting with Carl Chen today. He does not state that he is ready to quit.    Number of packs per day  uses tobacco pouches about 3  a day    Intervention  Assist the participant in steps to  quit. Provide individualized education and counseling about committing to Tobacco Cessation, relapse prevention, and pharmacological support that can be provided by physician.;Advice worker, assist with locating and accessing local/national Quit Smoking programs, and support quit date choice.    Expected Outcomes  Short Term: Will demonstrate readiness to quit, by selecting a quit date.;Short Term: Will quit all tobacco product use, adhering to prevention of relapse plan.;Long Term: Complete abstinence from all tobacco products for at least 12 months from quit date.    Diabetes  Yes    Intervention  Provide education about signs/symptoms and action to take for hypo/hyperglycemia.;Provide education about proper nutrition, including hydration, and aerobic/resistive exercise prescription along with prescribed medications to achieve blood glucose in normal ranges: Fasting glucose 65-99 mg/dL    Expected Outcomes  Short Term: Participant verbalizes understanding of the signs/symptoms and immediate care of hyper/hypoglycemia, proper foot care and importance of medication, aerobic/resistive exercise and nutrition plan for blood glucose control.;Long Term: Attainment of HbA1C < 7%.    Heart Failure  Yes   Carl Chen does not have scales at home.  Will check to see if we can get him some scales.    Intervention  Provide a combined exercise and nutrition program that is supplemented with education, support and counseling about heart failure. Directed toward relieving symptoms such as shortness of breath, decreased exercise tolerance, and extremity edema.    Expected Outcomes  Improve functional capacity of life;Short term: Attendance in program 2-3 days a week with increased exercise capacity. Reported lower sodium intake. Reported increased fruit and vegetable intake. Reports medication compliance.;Short term: Daily weights obtained and reported for increase. Utilizing diuretic protocols set by  physician.;Long term: Adoption of self-care skills and reduction of barriers for early signs and symptoms recognition and intervention leading to self-care  maintenance.    Lipids  Yes    Intervention  Provide education and support for participant on nutrition & aerobic/resistive exercise along with prescribed medications to achieve LDL <57m, HDL >455m    Expected Outcomes  Short Term: Participant states understanding of desired cholesterol values and is compliant with medications prescribed. Participant is following exercise prescription and nutrition guidelines.;Long Term: Cholesterol controlled with medications as prescribed, with individualized exercise RX and with personalized nutrition plan. Value goals: LDL < 7061mHDL > 40 mg.       Core Components/Risk Factors/Patient Goals Review:  Goals and Risk Factor Review    Row Name 12/31/17 0809             Core Components/Risk Factors/Patient Goals Review   Personal Goals Review  Weight Management/Obesity;Tobacco Cessation;Hypertension;Diabetes;Lipids       Review  Carl Chen's weight has been staying steady.  He continues to slowly cut back on his tobacco use. He is trying to cut back. His blood sugars are improving and he checks them twice a day.  His blood pressures are doing good in class.  He does not check it at home as he does not have a cuff at home.  He is doing well with his medication and tolerating the  entresto fairly well.        Expected Outcomes  Short: Continue to work on [employer] [employer]; Continue to monitor risk factors.            Core Components/Risk Factors/Patient Goals at Discharge (Final Review):  Goals and Risk Factor Review - 12/31/17 0809      Core Components/Risk Factors/Patient Goals Review   Personal Goals Review  Weight Management/Obesity;Tobacco Cessation;Hypertension;Diabetes;Lipids    Review  Carl Chen's weight has been staying steady.  He continues to slowly cut back on his tobacco use. He is trying to  cut back. His blood sugars are improving and he checks them twice a day.  His blood pressures are doing good in class.  He does not check it at home as he does not have a cuff at home.  He is doing well with his medication and tolerating the  entresto fairly well.     Expected Outcomes  Short: Continue to work on [employer]; Continue to monitor risk factors.         ITP Comments: ITP Comments    Row Name 12/14/17 1337 12/23/17 0556 01/19/18 0623       ITP Comments  Medical evaluation completed today. ITP sent to Dr M MLoleta Chancer review,changes as needed and signature. [facility]  30 day review. Continue with ITP unless direccted changes per Medical Director Chart Review. New to  program  30 Day Review. Continue with ITP unless directed changes per Medical Director review  New to program        Comments:

## 2018-01-19 NOTE — Progress Notes (Signed)
Daily Session Note  Patient Details  Name: Carl Chen MRN: [id number] Date of Birth: December 15, 1940 Referring Provider:     Cardiac Rehab from 12/14/2017 in [facility] Cardiac and Pulmonary Rehab  Referring Provider  [name] MD      Encounter Date: 01/19/2018  Check In: Session Check In - 01/19/18 0847      Check-In   Supervising physician immediately available to respond to emergencies  See telemetry face sheet for immediately available ER MD    Location  [facility]-Cardiac & Pulmonary Rehab    Staff Present  Gerlene Burdock, RN, BSN;Jeanna Durrell BS, Exercise Physiologist;Nicolasa Milbrath Oletta Darter, BA, ACSM CEP, Exercise Physiologist    Medication changes reported      No    Fall or balance concerns reported     No    Warm-up and Cool-down  Performed as group-led instruction    Resistance Training Performed  Yes    VAD Patient?  No    PAD/SET Patient?  No      Pain Assessment   Currently in Pain?  No/denies    Multiple Pain Sites  No          Social History   Tobacco Use  Smoking Status Former Smoker  . Packs/day: 3.00  . Years: 60.00  . Pack years: 180.00  Smokeless Tobacco Current User  . Types: Chew  Tobacco Comment   12/12  Down to three pouches a day.    Goals Met:  Independence with exercise equipment Exercise tolerated well No report of cardiac concerns or symptoms Strength training completed today  Goals Unmet:  Not Applicable  Comments: Pt able to follow exercise prescription today without complaint.  Will continue to monitor for progression.    Dr. Emily Filbert is Medical Director for [name] and [name].

## 2018-01-21 ENCOUNTER — Encounter: Payer: Medicare Other | Attending: Cardiology

## 2018-01-21 DIAGNOSIS — I5022 Chronic systolic (congestive) heart failure: Secondary | ICD-10-CM | POA: Diagnosis present

## 2018-01-21 DIAGNOSIS — I10 Essential (primary) hypertension: Secondary | ICD-10-CM | POA: Insufficient documentation

## 2018-01-21 DIAGNOSIS — Z79899 Other long term (current) drug therapy: Secondary | ICD-10-CM | POA: Diagnosis not present

## 2018-01-21 DIAGNOSIS — E119 Type 2 diabetes mellitus without complications: Secondary | ICD-10-CM | POA: Diagnosis not present

## 2018-01-21 DIAGNOSIS — Z720 Tobacco use: Secondary | ICD-10-CM | POA: Insufficient documentation

## 2018-01-21 DIAGNOSIS — Z794 Long term (current) use of insulin: Secondary | ICD-10-CM | POA: Insufficient documentation

## 2018-01-21 NOTE — Progress Notes (Signed)
Daily Session Note  Patient Details  Name: Carl Chen MRN: [id number] Date of Birth: 1940-01-29 Referring Provider:     Cardiac Rehab from 12/14/2017 in [facility] Cardiac and Pulmonary Rehab  Referring Provider  [name] MD      Encounter Date: 01/21/2018  Check In: Session Check In - 01/21/18 0918      Check-In   Supervising physician immediately available to respond to emergencies  See telemetry face sheet for immediately available ER MD    Location  [facility]-Cardiac & Pulmonary Rehab    Staff Present  Justin Mend RCP,RRT,BSRT;Amanda Oletta Darter, BA, ACSM CEP, Exercise Physiologist;Carroll Enterkin, RN, BSN;Jeanna Durrell BS, Exercise Physiologist    Medication changes reported      No    Fall or balance concerns reported     No    Warm-up and Cool-down  Performed as group-led instruction    Resistance Training Performed  Yes    VAD Patient?  No    PAD/SET Patient?  No      Pain Assessment   Currently in Pain?  No/denies          Social History   Tobacco Use  Smoking Status Former Smoker  . Packs/day: 3.00  . Years: 60.00  . Pack years: 180.00  Smokeless Tobacco Current User  . Types: Chew  Tobacco Comment   12/12  Down to three pouches a day.    Goals Met:  Independence with exercise equipment Exercise tolerated well No report of cardiac concerns or symptoms Strength training completed today  Goals Unmet:  Not Applicable  Comments: Pt able to follow exercise prescription today without complaint.  Will continue to monitor for progression.    Dr. Emily Filbert is Medical Director for [place] and [name].

## 2018-01-26 DIAGNOSIS — I5022 Chronic systolic (congestive) heart failure: Secondary | ICD-10-CM | POA: Diagnosis not present

## 2018-01-26 NOTE — Progress Notes (Signed)
Daily Session Note  Patient Details  Name: Carl Chen MRN: [id number] Date of Birth: 1940-08-13 Referring Provider:     Cardiac Rehab from 12/14/2017 in [facility] Cardiac and Pulmonary Rehab  Referring Provider  [name] MD      Encounter Date: 01/26/2018  Check In: Session Check In - 01/26/18 0800      Check-In   Supervising physician immediately available to respond to emergencies  See telemetry face sheet for immediately available ER MD    Location  [facility]-Cardiac & Pulmonary Rehab    Staff Present  Heath Lark, RN, BSN, CCRP;Jeanna Durrell BS, Exercise Physiologist    Medication changes reported      No    Fall or balance concerns reported     No    Warm-up and Cool-down  Performed as group-led instruction    Resistance Training Performed  Yes    VAD Patient?  No    PAD/SET Patient?  No      PAD/SET Patient   Completed foot check today?  No          Social History   Tobacco Use  Smoking Status Former Smoker  . Packs/day: 3.00  . Years: 60.00  . Pack years: 180.00  Smokeless Tobacco Current User  . Types: Chew  Tobacco Comment   12/12  Down to three pouches a day.    Goals Met:  Independence with exercise equipment Exercise tolerated well Personal goals reviewed No report of cardiac concerns or symptoms Strength training completed today  Goals Unmet:  Not Applicable  Comments: Pt able to follow exercise prescription today without complaint.  Will continue to monitor for progression.    Dr. Emily Filbert is Medical Director for [place] and [name].

## 2018-01-28 ENCOUNTER — Encounter: Payer: Medicare Other | Admitting: *Deleted

## 2018-01-28 DIAGNOSIS — I5022 Chronic systolic (congestive) heart failure: Secondary | ICD-10-CM

## 2018-01-28 NOTE — Progress Notes (Signed)
Daily Session Note  Patient Details  Name: Carl Chen MRN: [id number] Date of Birth: 02-01-1940 Referring Provider:     Cardiac Rehab from 12/14/2017 in [facility] Cardiac and Pulmonary Rehab  Referring Provider  Carl Novas MD      Encounter Date: 01/28/2018  Check In: Session Check In - 01/28/18 0739      Check-In   Supervising physician immediately available to respond to emergencies  See telemetry face sheet for immediately available ER MD    Location  [facility]-Cardiac & Pulmonary Rehab    Staff Present  Gerlene Burdock, RN, BSN;Jeanna Durrell BS, Exercise Physiologist    Medication changes reported      No    Fall or balance concerns reported     No    Tobacco Cessation  No Change    Warm-up and Cool-down  Performed on first and last piece of equipment    Resistance Training Performed  Yes    VAD Patient?  No    PAD/SET Patient?  No      Pain Assessment   Currently in Pain?  No/denies          Social History   Tobacco Use  Smoking Status Former Smoker  . Packs/day: 3.00  . Years: 60.00  . Pack years: 180.00  Smokeless Tobacco Current User  . Types: Chew  Tobacco Comment   12/12  Down to three pouches a day.    Goals Met:  Independence with exercise equipment Exercise tolerated well Personal goals reviewed No report of cardiac concerns or symptoms Strength training completed today  Goals Unmet:  Not Applicable  Comments: Pt able to follow exercise prescription today without complaint.  Will continue to monitor for progression.    Dr. Emily Filbert is Medical Director for [place] and [name].

## 2018-02-02 DIAGNOSIS — I5022 Chronic systolic (congestive) heart failure: Secondary | ICD-10-CM

## 2018-02-02 NOTE — Progress Notes (Signed)
Daily Session Note  Patient Details  Name: Carl Chen MRN: [id number] Date of Birth: Oct 20, 1940 Referring Provider:     Cardiac Rehab from 12/14/2017 in [facility] Cardiac and Pulmonary Rehab  Referring Provider  [name] MD      Encounter Date: 02/02/2018  Check In: Session Check In - 02/02/18 0754      Check-In   Supervising physician immediately available to respond to emergencies  See telemetry face sheet for immediately available ER MD    Location  [facility]-Cardiac & Pulmonary Rehab    Staff Present  Heath Lark, RN, BSN, CCRP;Jessica [place], MA, RCEP, CCRP, Exercise Physiologist;Amanda Oletta Darter, [place], ACSM CEP, Exercise Physiologist    Medication changes reported      No    Fall or balance concerns reported     No    Warm-up and Cool-down  Performed as group-led instruction    Resistance Training Performed  Yes    VAD Patient?  No    PAD/SET Patient?  No      Pain Assessment   Currently in Pain?  No/denies    Multiple Pain Sites  No          Social History   Tobacco Use  Smoking Status Former Smoker  . Packs/day: 3.00  . Years: 60.00  . Pack years: 180.00  Smokeless Tobacco Current User  . Types: Chew  Tobacco Comment   12/12  Down to three pouches a day.    Goals Met:  Independence with exercise equipment Exercise tolerated well No report of cardiac concerns or symptoms Strength training completed today  Goals Unmet:  Not Applicable  Comments: Pt able to follow exercise prescription today without complaint.  Will continue to monitor for progression.    Dr. Emily Filbert is Medical Director for [place] and [name].

## 2018-02-04 ENCOUNTER — Encounter: Payer: Medicare Other | Admitting: *Deleted

## 2018-02-04 DIAGNOSIS — I5022 Chronic systolic (congestive) heart failure: Secondary | ICD-10-CM | POA: Diagnosis not present

## 2018-02-04 NOTE — Progress Notes (Signed)
Daily Session Note  Patient Details  Name: Carl Chen MRN: [id number] Date of Birth: 28-May-1940 Referring Provider:     Cardiac Rehab from 12/14/2017 in [facility] Cardiac and Pulmonary Rehab  Referring Provider  [name] MD      Encounter Date: 02/04/2018  Check In: Session Check In - 02/04/18 0756      Check-In   Supervising physician immediately available to respond to emergencies  See telemetry face sheet for immediately available ER MD    Location  [facility]-Cardiac & Pulmonary Rehab    Staff Present  Vida Rigger RN, BSN;Jeanna Durrell BS, Exercise Physiologist    Medication changes reported      No    Fall or balance concerns reported     No    Warm-up and Cool-down  Performed as group-led instruction    Resistance Training Performed  Yes    VAD Patient?  No    PAD/SET Patient?  No      Pain Assessment   Currently in Pain?  No/denies          Social History   Tobacco Use  Smoking Status Former Smoker  . Packs/day: 3.00  . Years: 60.00  . Pack years: 180.00  Smokeless Tobacco Current User  . Types: Chew  Tobacco Comment   12/12  Down to three pouches a day.    Goals Met:  Independence with exercise equipment Exercise tolerated well No report of cardiac concerns or symptoms Strength training completed today  Goals Unmet:  Not Applicable  Comments: Pt able to follow exercise prescription today without complaint.  Will continue to monitor for progression.    Dr. Emily Filbert is Medical Director for [place] and [name].

## 2018-02-11 ENCOUNTER — Encounter: Payer: Medicare Other | Admitting: *Deleted

## 2018-02-11 DIAGNOSIS — I5022 Chronic systolic (congestive) heart failure: Secondary | ICD-10-CM | POA: Diagnosis not present

## 2018-02-11 NOTE — Progress Notes (Signed)
Daily Session Note  Patient Details  Name: SAMNANG SHUGARS MRN: [id number] Date of Birth: 1940/11/09 Referring Provider:     Cardiac Rehab from 12/14/2017 in [facility]  Referring Provider  [name] MD      Encounter Date: 02/11/2018  Check In: Session Check In - 02/11/18 0751      Check-In   Supervising physician immediately available to respond to emergencies  See telemetry face sheet for immediately available ER MD    Location  [facility]-Cardiac & Pulmonary Rehab    Staff Present  Gerlene Burdock, RN, BSN;Jeanna Durrell BS, Exercise Physiologist    Medication changes reported      No    Fall or balance concerns reported     No    Warm-up and Cool-down  Performed as group-led instruction    Resistance Training Performed  Yes    VAD Patient?  No    PAD/SET Patient?  No      Pain Assessment   Currently in Pain?  No/denies          Social History   Tobacco Use  Smoking Status Former Smoker  . Packs/day: 3.00  . Years: 60.00  . Pack years: 180.00  Smokeless Tobacco Current User  . Types: Chew  Tobacco Comment   12/12  Down to three pouches a day.    Goals Met:  Independence with exercise equipment Exercise tolerated well No report of cardiac concerns or symptoms Strength training completed today  Goals Unmet:  Not Applicable  Comments: Pt able to follow exercise prescription today without complaint.  Will continue to monitor for progression.    Dr. Emily Filbert is Medical Director for [facility] and [name].

## 2018-02-16 VITALS — Ht 71.1 in | Wt 153.0 lb

## 2018-02-16 DIAGNOSIS — I5022 Chronic systolic (congestive) heart failure: Secondary | ICD-10-CM

## 2018-02-16 NOTE — Progress Notes (Signed)
Daily Session Note  Patient Details  Name: Carl Chen MRN: [id number] Date of Birth: 1940-04-25 Referring Provider:     Cardiac Rehab from 12/14/2017 in [facility] Cardiac and Pul[facility] Rehab  Referring Provider  [name] MD      Encounter Date: 02/16/2018  Check In: Session Check In - 02/16/18 0754      Check-In   Supervising physician immediately available to respond to emergencies  See telemetry face sheet for immediately available ER MD    Location  [facility]-Cardiac & Pul[facility]    Staff Present  Jasper Loser BS, Exercise Physiologist;Jessica Luan Pulling, MA, RCEP, CCRP, Exercise Physiologist; Oletta Darter, BA, ACSM CEP, Exercise Physiologist;Krista Frederico Hamman, RN BSN    Medication changes reported      No    Fall or balance concerns reported     No    Warm-up and Cool-down  Performed as group-led Higher education careers adviser Performed  Yes    VAD Patient?  No    PAD/SET Patient?  No      PAD/SET Patient   Completed foot check today?  No    Open wounds to report?  No      Pain Assessment   Currently in Pain?  No/denies    Multiple Pain Sites  No          Social History   Tobacco Use  Smoking Status Former Smoker  . Packs/day: 3.00  . Years: 60.00  . Pack years: 180.00  Smokeless Tobacco Current User  . Types: Chew  Tobacco Comment   12/12  Down to three pouches a day.    Goals Met:  Independence with exercise equipment Exercise tolerated well No report of cardiac concerns or symptoms Strength training completed today  Goals Unmet:  Not Applicable  Comments: 6 Minute Walk    Row Name 12/14/17 1411         6 Minute Walk   Phase  Initial     Distance  1360 feet     Walk Time  6 minutes     # of Rest Breaks  0     MPH  2.58     METS  3.05     RPE  9     VO2 Peak  10.67     Symptoms  No     Resting HR  76 bpm     Resting BP  126/74     Resting Oxygen Saturation   97 %     Exercise Oxygen Saturation  during 6 min walk  96 %     Max  Ex. HR  80 bpm     Max Ex. BP  146/70     2 Minute Post BP  126/62         Dr. Emily Filbert is Medical Director for [place] [name].

## 2018-02-17 DIAGNOSIS — I5022 Chronic systolic (congestive) heart failure: Secondary | ICD-10-CM

## 2018-02-17 NOTE — Progress Notes (Signed)
Cardiac Individual Treatment Plan  Patient Details  Name: Carl Chen MRN: [id number] Date of Birth: 03-Dec-1940 Referring Provider:     Cardiac Rehab from 12/14/2017 in [facility]  Referring Provider  [name] MD      Initial Encounter Date:    Cardiac Rehab from 12/14/2017 in [facility]  Date  12/14/17      Visit Diagnosis: Heart failure, chronic systolic ([place])  Patient's Home Medications on Admission:  Current Outpatient Medications:  .  amLODipine (NORVASC) 5 MG tablet, , Disp: , Rfl:  .  amLODipine-olmesartan (AZOR) 5-20 MG per tablet, Take 1 tablet by mouth daily., Disp: , Rfl:  .  Cinnamon 500 MG capsule, Take by mouth., Disp: , Rfl:  .  ENTRESTO 24-26 MG, , Disp: , Rfl:  .  glipiZIDE (GLUCOTROL XL) 5 MG 24 hr tablet, Take 5 mg by mouth daily with breakfast., Disp: , Rfl:  .  Insulin Isophane & Regular Human (HUMULIN 70/30 KWIKPEN) (70-30) 100 UNIT/ML PEN, Inject into the skin., Disp: , Rfl:  .  Investigational - Study Medication, Take 35 mg by mouth daily. Take o each by mouth Daily before dinner 35 mg empaglifozin GQQ/PY19 mg placebo. Study is being conducted by [employer] ,[place], Disp: , Rfl:  .  losartan (COZAAR) 50 MG tablet, Take 50 mg by mouth daily., Disp: , Rfl:  .  magnesium oxide (MAG-OX) 400 MG tablet, Take by mouth., Disp: , Rfl:  .  metFORMIN (GLUCOPHAGE) 1000 MG tablet, Take 1,000 mg by mouth 2 (two) times daily with a meal., Disp: , Rfl:  .  metoprolol succinate (TOPROL-XL) 25 MG 24 hr tablet, Take by mouth., Disp: , Rfl:  .  nystatin-triamcinolone ointment (MYCOLOG), Apply 1 application topically 2 (two) times daily., Disp: 30 g, Rfl: 0 .  simvastatin (ZOCOR) 20 MG tablet, Take 20 mg by mouth daily., Disp: , Rfl:  .  Urea (URAMAXIN EX), Apply topically., Disp: , Rfl:  .  vitamin B-12 (CYANOCOBALAMIN) 500 MCG tablet, Take by mouth., Disp: , Rfl:   Past Medical History: Past Medical History:   Diagnosis Date  . Diabetes ([place])   . Hypertension     Tobacco Use: Social History   Tobacco Use  Smoking Status Former Smoker  . Packs/day: 3.00  . Years: 60.00  . Pack years: 180.00  Smokeless Tobacco Current User  . Types: Chew  Tobacco Comment   12/12  Down to three pouches a day.    Labs: Recent Review Flowsheet Data    There is no flowsheet data to display.       Exercise Target Goals: Exercise Program Goal: Individual exercise prescription set using results from initial 6 min walk test and THRR while considering  patient's activity barriers and safety.   Exercise Prescription Goal: Initial exercise prescription builds to 30-45 minutes a day of aerobic activity, 2-3 days per week.  Home exercise guidelines will be given to patient during program as part of exercise prescription that the participant will acknowledge.  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 Has had cortisone shots in the past   Cardiac Risk Stratification  High       6 Minute Walk: 6 Minute Walk    Row Name 12/14/17 1411  02/16/18 0837       6 Minute Walk   Phase  Initial  Discharge    Distance  1360 feet  1576 feet    Distance % Change  -  15.9 %    Distance Feet Change  -  216 ft    Walk Time  6 minutes  6 minutes    # of Rest Breaks  0  0    MPH  2.58  2.98    METS  3.05  3.34    RPE  9  9    VO2 Peak  10.67  11.68    Symptoms  No  No    Resting HR  76 bpm  96 bpm    Resting BP  126/74  104/58    Resting Oxygen Saturation   97 %  -    Exercise Oxygen Saturation  during 6 min walk  96 %  95 %    Max Ex. HR  80 bpm  93 bpm    Max Ex. BP  146/70  106/60    2 Minute Post BP  126/62  -       Oxygen Initial Assessment:   Oxygen  Re-Evaluation:   Oxygen Discharge (Final Oxygen Re-Evaluation):   Initial Exercise Prescription: Initial Exercise Prescription - 12/14/17 1400      Date of Initial Exercise RX and Referring Provider   Date  12/14/17    Referring Provider  [name] MD      Treadmill   MPH  2.5    Grade  0.5    Minutes  15    METs  3.09      NuStep   Level  3    SPM  80    Minutes  15    METs  3      REL-XR   Level  2    Speed  50    Minutes  15    METs  3      Prescription Details   Frequency (times per week)  2    Duration  Progress to 30 minutes of continuous aerobic without signs/symptoms of physical distress      Intensity   THRR 40-80% of Max Heartrate  104-131    Ratings of Perceived Exertion  11-13    Perceived Dyspnea  0-4      Progression   Progression  Continue to progress workloads to maintain intensity without signs/symptoms of physical distress.      Resistance Training   Training Prescription  Yes    Weight  4 lbs    Reps  10-15       Perform Capillary Blood Glucose checks as needed.  Exercise Prescription Changes: Exercise Prescription Changes    Row Name 12/14/17 1300 12/24/17 0800 12/29/17 0900 01/11/18 1600 01/26/18 1300     Response to Exercise   Blood Pressure (Admit)  126/74  -  122/74  120/62  110/62   Blood Pressure (Exercise)  146/70  -  136/70  134/70  136/70   Blood Pressure (Exit)  126/62  -  122/74  128/70  104/62   Heart Rate (Admit)  76 bpm  -  66 bpm  80 bpm  81 bpm   Heart Rate (Exercise)  80 bpm  -  104 bpm  103 bpm  97 bpm   Heart Rate (Exit)  75 bpm  -  81 bpm  97 bpm  80 bpm   Oxygen Saturation (  Admit)  97 %  -  -  -  -   Oxygen Saturation (Exercise)  96 %  -  -  -  -   Rating of Perceived Exertion (Exercise)  9  -  _0 Symptoms  none  -  none  none  none   Comments  walk test results  -  -  -  -   Duration  -  -  Continue with 30 min of aerobic exercise without signs/symptoms of physical distress.  Continue with 30 min  of aerobic exercise without signs/symptoms of physical distress.  Continue with 30 min of aerobic exercise without signs/symptoms of physical distress.   Intensity  -  -  THRR unchanged  THRR unchanged  THRR unchanged     Progression   Progression  -  -  Continue to progress workloads to maintain intensity without signs/symptoms of physical distress.  Continue to progress workloads to maintain intensity without signs/symptoms of physical distress.  Continue to progress workloads to maintain intensity without signs/symptoms of physical distress.   Average METs  -  -  3.03  3.63  2.82     Resistance Training   Training Prescription  -  -  Yes  Yes  Yes   Weight  -  -  4 lbs  4 lbs  7 lbs   Reps  -  -  10-15  10-15  10-15     Interval Training   Interval Training  -  -  No  No  No     Treadmill   MPH  -  -  2.5  3  2.7   Grade  -  -  0.5  0.5  1   Minutes  -  -  _1 METs  -  -  3.09  3.5  3.44     NuStep   Level  -  -  4  4  -   Minutes  -  -  15  15  -   METs  -  -  3  2.9  -     REL-XR   Level  -  -  _2 Minutes  -  -  _3 METs  -  -  3  4.5  2.2     Home Exercise Plan   Plans to continue exercise at  -  Home (comment) walk  Home (comment) walk  Home (comment) walk  Home (comment) walk   Frequency  -  Add 3 additional days to program exercise sessions.  Add 3 additional days to program exercise sessions.  Add 3 additional days to program exercise sessions.  Add 3 additional days to program exercise sessions.   Initial Home Exercises Provided  -  12/24/17  12/24/17  12/24/17  12/24/17   Row Name 02/09/18 1300             Response to Exercise   Blood Pressure (Admit)  120/78       Blood Pressure (Exercise)  124/80       Blood Pressure (Exit)  110/80       Heart Rate (Admit)  84 bpm       Heart Rate (Exercise)  108 bpm       Heart Rate (Exit)  82 bpm       Rating of Perceived Exertion (  Exercise)  12       Symptoms  none       Duration  Continue  with 30 min of aerobic exercise without signs/symptoms of physical distress.       Intensity  THRR unchanged         Progression   Progression  Continue to progress workloads to maintain intensity without signs/symptoms of physical distress.       Average METs  3.89         Resistance Training   Training Prescription  Yes       Weight  7 lbs       Reps  10-15         Interval Training   Interval Training  No         Treadmill   MPH  3.3       Grade  1       Minutes  15       METs  3.89         NuStep   Level  4       Minutes  15       METs  3.8         REL-XR   Level  6       Minutes  15       METs  3.9         Home Exercise Plan   Plans to continue exercise at  Home (comment) walk       Frequency  Add 3 additional days to program exercise sessions.       Initial Home Exercises Provided  12/24/17          Exercise Comments: Exercise Comments    Row Name 12/22/17 0809 12/24/17 0837         Exercise Comments   First full day of exercise!  Patient was oriented to gym and equipment including functions, settings, policies, and procedures.  Patient's individual exercise prescription and treatment plan were reviewed.  All starting workloads were established based on the results of the 6 minute walk test done at initial orientation visit.  The plan for exercise progression was also introduced and progression will be customized based on patient's performance and goals.  Reviewed home exercise with pt today.  Pt plans to walk for exercise.  Reviewed THR, pulse, RPE, sign and symptoms, NTG use, and when to call 911 or MD.  Also discussed weather considerations and indoor options.  Pt voiced understanding.         Exercise Goals and Review: Exercise Goals    Row Name 12/14/17 1414             Exercise Goals   Increase Physical Activity  Yes       Intervention  Provide advice, education, support and counseling about physical activity/exercise needs.;Develop an  individualized exercise prescription for aerobic and resistive training based on initial evaluation findings, risk stratification, comorbidities and participant's personal goals.       Expected Outcomes  Short Term: Attend rehab on a regular basis to increase amount of physical activity.;Long Term: Exercising regularly at least 3-5 days a week.;Long Term: Add in home exercise to make exercise part of routine and to increase amount of physical activity.       Increase Strength and Stamina  Yes       Intervention  Provide advice, education, support and counseling about physical activity/exercise needs.;Develop an individualized exercise prescription for aerobic and  resistive training based on initial evaluation findings, risk stratification, comorbidities and participant's personal goals.       Expected Outcomes  Short Term: Increase workloads from initial exercise prescription for resistance, speed, and METs.;Short Term: Perform resistance training exercises routinely during rehab and add in resistance training at home;Long Term: Improve cardiorespiratory fitness, muscular endurance and strength as measured by increased METs and functional capacity (6MWT)       Able to understand and use rate of perceived exertion (RPE) scale  Yes       Intervention  Provide education and explanation on how to use RPE scale       Expected Outcomes  Short Term: Able to use RPE daily in rehab to express subjective intensity level;Long Term:  Able to use RPE to guide intensity level when exercising independently       Knowledge and understanding of Target Heart Rate Range (THRR)  Yes       Intervention  Provide education and explanation of THRR including how the numbers were predicted and where they are located for reference       Expected Outcomes  Short Term: Able to state/look up THRR;Short Term: Able to use daily as guideline for intensity in rehab;Long Term: Able to use THRR to govern intensity when exercising  independently       Able to check pulse independently  Yes       Intervention  Provide education and demonstration on how to check pulse in carotid and radial arteries.;Review the importance of being able to check your own pulse for safety during independent exercise       Expected Outcomes  Short Term: Able to explain why pulse checking is important during independent exercise;Long Term: Able to check pulse independently and accurately       Understanding of Exercise Prescription  Yes       Intervention  Provide education, explanation, and written materials on patient's individual exercise prescription       Expected Outcomes  Short Term: Able to explain program exercise prescription;Long Term: Able to explain home exercise prescription to exercise independently          Exercise Goals Re-Evaluation : Exercise Goals Re-Evaluation    Row Name 12/22/17 0810 12/24/17 0837 12/29/17 0943 12/31/17 0807 01/11/18 1619     Exercise Goal Re-Evaluation   Exercise Goals Review  Increase Physical Activity;Increase Strength and Stamina;Able to understand and use rate of perceived exertion (RPE) scale;Knowledge and understanding of Target Heart Rate Range (THRR);Understanding of Exercise Prescription  Increase Physical Activity;Increase Strength and Stamina;Able to understand and use rate of perceived exertion (RPE) scale;Knowledge and understanding of Target Heart Rate Range (THRR);Able to check pulse independently;Understanding of Exercise Prescription  Increase Physical Activity;Increase Strength and Stamina;Able to understand and use rate of perceived exertion (RPE) scale;Knowledge and understanding of Target Heart Rate Range (THRR);Able to check pulse independently;Understanding of Exercise Prescription  Increase Physical Activity;Increase Strength and Stamina;Understanding of Exercise Prescription  Increase Physical Activity;Increase Strength and Stamina;Understanding of Exercise Prescription   Comments   Reviewed RPE scale, THR and program prescription with pt today.  Pt voiced understanding and was given a copy of goals to take home.   [name] already walks for exercise on days not at [facility].  He will check HR and add some stretching and strength work to walking.  Reviewed home exercise with pt today.  Pt plans to walk at home for exercise.  Reviewed THR, pulse, RPE, sign and symptoms, NTG use, and when  to call 911 or MD.  Also discussed weather considerations and indoor options.  Pt voiced understanding.  [name] is doing well in rehab.  He has enjoyed coming and has not been getting out with the the cold as much. He is already interested in [employer].  He is starting to notice that his strength and stamina are coming back.   Cecille Rubin continues to do well in rehab.  He is up to level 5 on the XR and 3.0 mph on the treadmill.  We will continue to monitor his progression.    Expected Outcomes  Short: Use RPE daily to regulate intensity. Long: Follow program prescription in THR.  Short - check HR while walking and add strength work Long - mainain exercise on his own  Short: Start walking more at home. Long: Continue to exercise indpendently  Short: Continue to attend rehab regularly.  Long: Continue to increase strength and stamina.   Short: Continue to increase workloads to reach RPE of 11-13.  Long: Continue to exercise on off days.    [place] Name 01/26/18 1103 02/09/18 1313           Exercise Goal Re-Evaluation   Exercise Goals Review  Increase Physical Activity;Able to understand and use rate of perceived exertion (RPE) scale;Knowledge and understanding of Target Heart Rate Range (THRR);Understanding of Exercise Prescription;Increase Strength and Stamina;Able to check pulse independently  Increase Physical Activity;Increase Strength and Stamina;Understanding of Exercise Prescription      Comments  [name] continues to increase levels on equipment and tolerate the increase well. He is up to level 6 on the XR. He can  not increase weight in resistance training due to rotator cuff issues.   [name] has been doing well in rehab.  He is up to 3.3 mph on the treadmill.  We will continue to monitor his progress.       Expected Outcomes  Short: Continue to increase workloads and exercise at home. Long: Join the [facility] to continue exercise after graduation.  Short: Increase workload on NuStep.  Long: Continue to increase strength and stamina         Discharge Exercise Prescription (Final Exercise Prescription Changes): Exercise Prescription Changes - 02/09/18 1300      Response to Exercise   Blood Pressure (Admit)  120/78    Blood Pressure (Exercise)  124/80    Blood Pressure (Exit)  110/80    Heart Rate (Admit)  84 bpm    Heart Rate (Exercise)  108 bpm    Heart Rate (Exit)  82 bpm    Rating of Perceived Exertion (Exercise)  12    Symptoms  none    Duration  Continue with 30 min of aerobic exercise without signs/symptoms of physical distress.    Intensity  THRR unchanged      Progression   Progression  Continue to progress workloads to maintain intensity without signs/symptoms of physical distress.    Average METs  3.89      Resistance Training   Training Prescription  Yes    Weight  7 lbs    Reps  10-15      Interval Training   Interval Training  No      Treadmill   MPH  3.3    Grade  1    Minutes  15    METs  3.89      NuStep   Level  4    Minutes  15    METs  3.8  REL-XR   Level  6    Minutes  15    METs  3.9      Home Exercise Plan   Plans to continue exercise at  Home (comment)   walk   Frequency  Add 3 additional days to program exercise sessions.    Initial Home Exercises Provided  12/24/17       Nutrition:  Target Goals: Understanding of nutrition guidelines, daily intake of sodium <1538m, cholesterol <2063m calories 30% from fat and 7% or less from saturated fats, daily to have 5 or more servings of fruits and vegetables.  Biometrics: Pre Biometrics - 12/14/17  1415      Pre Biometrics   Height  5' 11.1" (1.806 m)    Weight  158 lb 1.6 oz (71.7 kg)    Waist Circumference  32 inches    Hip Circumference  38 inches    Waist to Hip Ratio  0.84 %    BMI (Calculated)  21.99    Single Leg Stand  15.3 seconds      Post Biometrics - 02/16/18 0836       Post  Biometrics   Height  5' 11.1" (1.806 m)    Weight  153 lb (69.4 kg)    Waist Circumference  32 inches    Hip Circumference  38 inches    Waist to Hip Ratio  0.84 %    BMI (Calculated)  21.28    Single Leg Stand  23.46 seconds       Nutrition Therapy Plan and Nutrition Goals: Nutrition Therapy & Goals - 12/22/17 0857      Nutrition Therapy   Diet  DM    Protein (specify units)  12oz    Fiber  30 grams    Whole Grain Foods  3 servings   does not currently choose whole grain options   Saturated Fats  16 max. grams    Fruits and Vegetables  5 servings/day   8 ideal; likely not meeting recommended daily intake of fruits, eats mostly starchy vegetables   Sodium  1500 grams      Personal Nutrition Goals   Nutrition Goal  Eat at least one additional serving of fruit per week    Personal Goal #2  Because you desire weight gain but do not typically eat 3 meals/day or eat large portions at meal times, it may be helpful to be more consistent about drinking 1-2 Atkins/ Boost/ Glucerna shakes daily, and/or adding in an evening snack regularly    Personal Goal #3  Before coming to class for exercise, try to consume something small such as a granola bar rather than nothing    Comments  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 He reads labels to choose items that are low in sugar and sodium and tries to eat "lots of greens." Drinks sugar free beverages as well. He does not typically eat breakfast and may have a small dinner; lunch is his main meal. He will occasionall eat a granola bar or drink an Atkins shake in the mornings. Lunch/ Dinner: burger + fries, salad, sandwich. Does not snack  often but typically chooses PB crackers if so. Eats canned soups regularly. He does not choose whole wheat products at this time. Dairy foods consumed: cheese.       Intervention Plan   Intervention  Prescribe, educate and counsel regarding individualized specific dietary modifications aiming towards targeted core components such as weight, hypertension, lipid management,  diabetes, heart failure and other comorbidities.    Expected Outcomes  Short Term Goal: Understand basic principles of dietary content, such as calories, fat, sodium, cholesterol and nutrients.;Short Term Goal: A plan has been developed with personal nutrition goals set during dietitian appointment.;Long Term Goal: Adherence to prescribed nutrition plan.       Nutrition Assessments: Nutrition Assessments - 12/14/17 1356      MEDFICTS Scores   Pre Score  6       Nutrition Goals Re-Evaluation: Nutrition Goals Re-Evaluation    Row Name 12/22/17 0908 01/26/18 0846 01/28/18 0743         Goals   Current Weight  -  -  156 lb (70.8 kg)     Nutrition Goal  Because you desire weight gain but do not typically eat 3 meals/day or eat large portions at meal times, it may be helpful to be more consistent about drinking 1-2 Atkins/ Boost/ Glucerna shakes daily, and/or adding in an evening snack regularly  Be more consistent about drinking an Atkins, Boost, or Glucerna shake daily and/or adding a snack _0  more often; Eat at least one additional serving of fruit per week; Try to eat something small before coming to class to exercise rather than skipping breakfast  Drinking Atkins shake every am with "15 grams of protein"     Comment  He has been advised by his physician to gain some weight, but finds this challenging while taking Metformin. He also does not have a large appetite. His daughter purchased him Ensure to try but it did not effect his blood sugars in a positive way  He continues to eat a variety of vegetables and has been eating  more fruits. He drinks a nutritional drink semi-consistently and has trialed eating something small before coming to class but has not been consistent  -     Expected Outcome  He will either try drinking a low-glycemic nutritional shake 1-2x/day consistently, or add in a snack in the evenings to help facilitate weight gain. Long term goal: 5-10# wt gain  Continue to eat more fruits and maintain habit of eating vegetables regularly. Continue to work on drinking nutritional drinks consistently until desired BW is reached. Long term goal: eat something small in the morning especially on exercise days   Cont to eat healthy       Personal Goal #2 Re-Evaluation   Personal Goal #2  Eat at least one additional serving of fruit per week  -  -       Personal Goal #3 Re-Evaluation   Personal Goal #3  Before coming to class for exercise, try to consume something small such as a granola bar rather than nothing  -  -        Nutrition Goals Discharge (Final Nutrition Goals Re-Evaluation): Nutrition Goals Re-Evaluation - 01/28/18 0743      Goals   Current Weight  156 lb (70.8 kg)    Nutrition Goal  Drinking Atkins shake every am with "15 grams of protein"    Expected Outcome  Cont to eat healthy       Psychosocial: Target Goals: Acknowledge presence or absence of significant depression and/or stress, maximize coping skills, provide positive support system. Participant is able to verbalize types and ability to use techniques and skills needed for reducing stress and depression.   Initial Review & Psychosocial Screening: Initial Psych Review & Screening - 12/14/17 1353      Initial Review   Current issues with  None Identified      Family Dynamics   Good Support System?  Yes   daughter-Michelle     Barriers   Psychosocial barriers to participate in program  There are no identifiable barriers or psychosocial needs.;The patient should benefit from training in stress management and relaxation.       Screening Interventions   Interventions  Encouraged to exercise;To provide support and resources with identified psychosocial needs;Provide feedback about the scores to participant    Expected Outcomes  Short Term goal: Utilizing psychosocial counselor, staff and physician to assist with identification of specific Stressors or current issues interfering with healing process. Setting desired goal for each stressor or current issue identified.;Long Term Goal: Stressors or current issues are controlled or eliminated.;Short Term goal: Identification and review with participant of any Quality of Life or Depression concerns found by scoring the questionnaire.;Long Term goal: The participant improves quality of Life and PHQ9 Scores as seen by post scores and/or verbalization of changes       Quality of Life Scores:  Quality of Life - 12/14/17 1355      Quality of Life   Select  Quality of Life      Quality of Life Scores   Health/Function Pre  25.61 %    Socioeconomic Pre  23.57 %    Psych/Spiritual Pre  23.57 %    Family Pre  25.63 %    GLOBAL Pre  24.72 %      Scores of 19 and below usually indicate a poorer quality of life in these areas.  A difference of  2-3 points is a clinically meaningful difference.  A difference of 2-3 points in the total score of the Quality of Life Index has been associated with significant improvement in overall quality of life, self-image, physical symptoms, and general health in studies assessing change in quality of life.  PHQ-9: Recent Review Flowsheet Data    Depression screen [facility] 2/9 12/14/2017   Decreased Interest 0   Down, Depressed, Hopeless 0   PHQ - 2 Score 0   Altered sleeping 0   Tired, decreased energy 0   Change in appetite 0   Feeling bad or failure about yourself  0   Trouble concentrating 0   Moving slowly or fidgety/restless 0   Suicidal thoughts 0   PHQ-9 Score 0   Difficult doing work/chores Not difficult at all     Interpretation of  Total Score  Total Score Depression Severity:  1-4 = Minimal depression, 5-9 = Mild depression, 10-14 = Moderate depression, 15-19 = Moderately severe depression, 20-27 = Severe depression   Psychosocial Evaluation and Intervention: Psychosocial Evaluation - 12/29/17 0952      Psychosocial Evaluation & Interventions   Interventions  Stress management education;Encouraged to exercise with the program and follow exercise prescription    Comments  Counselor met with Mr. Reason [name]) today for initial psychosocial evaluation.  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  He is also a diabetic.  [name] has a strong support system with (2) adult children and active involvement in his local church.  He sleeps well and has a good appetite.  [name] denies a history of depression or anxiety and is typically in a positive mood.  He has minimal stress in his life other than his daughter who works long hours in her business - and realizes he has little control over this.  [name] has goals to increase his stamina  and strength while in this program to get back to walking 3 miles per day like he has in the past.  He states others encourage him to gain weight but this is difficult with all the medications he is taking currently.  Staff will follow with him.    Expected Outcomes  Short:  [name] will practice stress management strategies - including exercise while in this program.  Long:  [name] will develop a routine of positive coping strategies for his health and his mental health.     Continue Psychosocial Services   Follow up required by staff       Psychosocial Re-Evaluation: Psychosocial Re-Evaluation    [place] Name 01/26/18 1105 01/28/18 0741           Psychosocial Re-Evaluation   Current issues with  Current Stress Concerns  Current Stress Concerns      Comments  [name] reports that he is having car trouble and has had trouble getting his car inspected. He was upset by this but has  continued working through it and figured out the problem. He watches TV to help him relax.[name] is still having car problems and want to get his older truck and get it passed inspection (a 2001). His friend is going to look at it today.       Expected Outcomes  Short: Continue using stress management techniques to avoid getting too upset and relaxation techniques. Long: Manage stress well and find other ways to relax to better handle stress.  Cont stress release         Psychosocial Discharge (Final Psychosocial Re-Evaluation): Psychosocial Re-Evaluation - 01/28/18 0741      Psychosocial Re-Evaluation   Current issues with  Current Stress Concerns    Comments  [name] is still having car problems and want to get his older truck and get it passed inspection (a 2001). His friend is going to look at it today.     Expected Outcomes  Cont stress release       Vocational Rehabilitation: Provide vocational rehab assistance to qualifying candidates.   Vocational Rehab Evaluation & Intervention: Vocational Rehab - 12/14/17 1357      Initial Vocational Rehab Evaluation & Intervention   Assessment shows need for Vocational Rehabilitation  No       Education: Education Goals: Education classes will be provided on a variety of topics geared toward better understanding of heart health and risk factor modification. Participant will state understanding/return demonstration of topics presented as noted by education test scores.  Learning Barriers/Preferences: Learning Barriers/Preferences - 12/14/17 1356      Learning Barriers/Preferences   Learning Barriers  None    Learning Preferences  None       Education Topics:  AED/CPR: - Group verbal and written instruction with the use of models to demonstrate the basic use of the AED with the basic ABC's of resuscitation.   Cardiac Rehab from 02/16/2018 in [facility] Cardiac and Pulmonary Rehab  Date  01/19/18  Educator  CE  Instruction Review Code   1- Verbalizes Understanding      General Nutrition Guidelines/Fats and Fiber: -Group instruction provided by verbal, written material, models and posters to present the general guidelines for heart healthy nutrition. Gives an explanation and review of dietary fats and fiber.   Cardiac Rehab from 02/16/2018 in [facility] Cardiac and Pulmonary Rehab  Date  02/02/18  Educator  LB  Instruction Review Code  1- Verbalizes Understanding      Controlling Sodium/Reading  Food Labels: -Group verbal and written material supporting the discussion of sodium use in heart healthy nutrition. Review and explanation with models, verbal and written materials for utilization of the food label.   Cardiac Rehab from 02/16/2018 in [facility] Cardiac and Pulmonary Rehab  Date  02/04/18  Educator  LB  Instruction Review Code  1- Verbalizes Understanding      Exercise Physiology & General Exercise Guidelines: - Group verbal and written instruction with models to review the exercise physiology of the cardiovascular system and associated critical values. Provides general exercise guidelines with specific guidelines to those with heart or lung disease.    Aerobic Exercise & Resistance Training: - Gives group verbal and written instruction on the various components of exercise. Focuses on aerobic and resistive training programs and the benefits of this training and how to safely progress through these programs..   Cardiac Rehab from 02/16/2018 in [facility] Cardiac and Pulmonary Rehab  Date  02/11/18  Educator  [place]  Instruction Review Code  1- Verbalizes Understanding      Flexibility, Balance, Mind/Body Relaxation: Provides group verbal/written instruction on the benefits of flexibility and balance training, including mind/body exercise modes such as yoga, pilates and tai chi.  Demonstration and skill practice provided.   Cardiac Rehab from 02/16/2018 in [facility] Cardiac and Pulmonary Rehab  Date  02/16/18  Educator  AS  Instruction  Review Code  1- Verbalizes Understanding      Stress and Anxiety: - Provides group verbal and written instruction about the health risks of elevated stress and causes of high stress.  Discuss the correlation between heart/lung disease and anxiety and treatment options. Review healthy ways to manage with stress and anxiety.   Depression: - Provides group verbal and written instruction on the correlation between heart/lung disease and depressed mood, treatment options, and the stigmas associated with seeking treatment.   Cardiac Rehab from 02/16/2018 in [facility] Cardiac and Pulmonary Rehab  Date  01/26/18  Educator  [facility]  Instruction Review Code  1- Verbalizes Understanding      Anatomy & Physiology of the Heart: - Group verbal and written instruction and models provide basic cardiac anatomy and physiology, with the coronary electrical and arterial systems. Review of Valvular disease and Heart Failure   Cardiac Rehab from 02/16/2018 in [facility] Cardiac and Pulmonary Rehab  Date  12/31/17  Educator  CE  Instruction Review Code  1- Verbalizes Understanding      Cardiac Procedures: - Group verbal and written instruction to review commonly prescribed medications for heart disease. Reviews the medication, class of the drug, and side effects. Includes the steps to properly store meds and maintain the prescription regimen. (beta blockers and nitrates)   Cardiac Rehab from 02/16/2018 in [facility] Cardiac and Pulmonary Rehab  Date  12/29/17  Educator  SB  Instruction Review Code  1- Verbalizes Understanding      Cardiac Medications I: - Group verbal and written instruction to review commonly prescribed medications for heart disease. Reviews the medication, class of the drug, and side effects. Includes the steps to properly store meds and maintain the prescription regimen.   Cardiac Rehab from 02/16/2018 in [facility] Cardiac and Pulmonary Rehab  Date  01/05/18  Educator  KS  Instruction Review Code  1-  Verbalizes Understanding      Cardiac Medications II: -Group verbal and written instruction to review commonly prescribed medications for heart disease. Reviews the medication, class of the drug, and side effects. (all other drug classes)   Cardiac  Rehab from 02/16/2018 in [facility] Cardiac and Pulmonary Rehab  Date  12/22/17  Educator  SB  Instruction Review Code  1- Verbalizes Understanding       Go Sex-Intimacy & Heart Disease, Get SMART - Goal Setting: - Group verbal and written instruction through game format to discuss heart disease and the return to sexual intimacy. Provides group verbal and written material to discuss and apply goal setting through the application of the S.M.A.R.T. Method.   Cardiac Rehab from 02/16/2018 in [facility] Cardiac and Pulmonary Rehab  Date  12/29/17  Educator  SB  Instruction Review Code  1- Verbalizes Understanding      Other Matters of the Heart: - Provides group verbal, written materials and models to describe Stable Angina and Peripheral Artery. Includes description of the disease process and treatment options available to the cardiac patient.   Cardiac Rehab from 02/16/2018 in [facility] Cardiac and Pulmonary Rehab  Date  12/31/17  Educator  CE  Instruction Review Code  1- Verbalizes Understanding      Exercise & Equipment Safety: - Individual verbal instruction and demonstration of equipment use and safety with use of the equipment.   Cardiac Rehab from 02/16/2018 in [facility] Cardiac and Pulmonary Rehab  Date  12/14/17  Educator  [facility]  Instruction Review Code  1- Verbalizes Understanding      Infection Prevention: - Provides verbal and written material to individual with discussion of infection control including proper hand washing and proper equipment cleaning during exercise session.   Falls Prevention: - Provides verbal and written material to individual with discussion of falls prevention and safety.   Cardiac Rehab from 02/16/2018 in [facility] Cardiac and  Pulmonary Rehab  Date  12/14/17  Educator  [facility]  Instruction Review Code  1- Verbalizes Understanding      Diabetes: - Individual verbal and written instruction to review signs/symptoms of diabetes, desired ranges of glucose level fasting, after meals and with exercise. Acknowledge that pre and post exercise glucose checks will be done for 3 sessions at entry of program.   Cardiac Rehab from 02/16/2018 in [facility] Cardiac and Pulmonary Rehab  Date  12/14/17  Educator  SB  Instruction Review Code  1- Verbalizes Understanding      Know Your Numbers and Risk Factors: -Group verbal and written instruction about important numbers in your health.  Discussion of what are risk factors and how they play a role in the disease process.  Review of Cholesterol, Blood Pressure, Diabetes, and BMI and the role they play in your overall health.   Cardiac Rehab from 02/16/2018 in [facility] Cardiac and Pulmonary Rehab  Date  12/22/17  Educator  SB  Instruction Review Code  1- Verbalizes Understanding      Sleep Hygiene: -Provides group verbal and written instruction about how sleep can affect your health.  Define sleep hygiene, discuss sleep cycles and impact of sleep habits. Review good sleep hygiene tips.    Other: -Provides group and verbal instruction on various topics (see comments)   Knowledge Questionnaire Score: Knowledge Questionnaire Score - 12/14/17 1356      Knowledge Questionnaire Score   Pre Score  22/26   Reviewed correct responses with [name] today. He verbalized understanding of the answers and had no further questions today.      Core Components/Risk Factors/Patient Goals at Admission: Personal Goals and Risk Factors at Admission - 12/14/17 1357      Core Components/Risk Factors/Patient Goals on Admission    Weight Management  Yes;Weight  Gain    Intervention  Weight Management: Develop a combined nutrition and exercise program designed to reach desired caloric intake, while  maintaining appropriate intake of nutrient and fiber, sodium and fats, and appropriate energy expenditure required for the weight goal.;Weight Management: Provide education and appropriate resources to help participant work on and attain dietary goals.    Admit Weight  158 lb 1.6 oz (71.7 kg)    Goal Weight: Short Term  160 lb (72.6 kg)    Goal Weight: Long Term  165 lb (74.8 kg)    Expected Outcomes  Short Term: Continue to assess and modify interventions until short term weight is achieved;Long Term: Adherence to nutrition and physical activity/exercise program aimed toward attainment of established weight goal;Weight Gain: Understanding of general recommendations for a high calorie, high protein meal plan that promotes weight gain by distributing calorie intake throughout the day with the consumption for 4-5 meals, snacks, and/or supplements   [name] has lost about 20 lbs and his doctor has asked him to gain some weight.   Tobacco Cessation  Yes   Reviewed steps toward quitting with [name] today. He does not state that he is ready to quit.    Number of packs per day  uses tobacco pouches about 3  a day    Intervention  Assist the participant in steps to quit. Provide individualized education and counseling about committing to Tobacco Cessation, relapse prevention, and pharmacological support that can be provided by physician.;Advice worker, assist with locating and accessing local/national Quit Smoking programs, and support quit date choice.    Expected Outcomes  Short Term: Will demonstrate readiness to quit, by selecting a quit date.;Short Term: Will quit all tobacco product use, adhering to prevention of relapse plan.;Long Term: Complete abstinence from all tobacco products for at least 12 months from quit date.    Diabetes  Yes    Intervention  Provide education about signs/symptoms and action to take for hypo/hyperglycemia.;Provide education about proper nutrition, including  hydration, and aerobic/resistive exercise prescription along with prescribed medications to achieve blood glucose in normal ranges: Fasting glucose 65-99 mg/dL    Expected Outcomes  Short Term: Participant verbalizes understanding of the signs/symptoms and immediate care of hyper/hypoglycemia, proper foot care and importance of medication, aerobic/resistive exercise and nutrition plan for blood glucose control.;Long Term: Attainment of HbA1C < 7%.    Heart Failure  Yes   [name] does not have scales at home.  Will check to see if we can get him some scales.    Intervention  Provide a combined exercise and nutrition program that is supplemented with education, support and counseling about heart failure. Directed toward relieving symptoms such as shortness of breath, decreased exercise tolerance, and extremity edema.    Expected Outcomes  Improve functional capacity of life;Short term: Attendance in program 2-3 days a week with increased exercise capacity. Reported lower sodium intake. Reported increased fruit and vegetable intake. Reports medication compliance.;Short term: Daily weights obtained and reported for increase. Utilizing diuretic protocols set by physician.;Long term: Adoption of self-care skills and reduction of barriers for early signs and symptoms recognition and intervention leading to self-care maintenance.    Lipids  Yes    Intervention  Provide education and support for participant on nutrition & aerobic/resistive exercise along with prescribed medications to achieve LDL <6m, HDL >484m    Expected Outcomes  Short Term: Participant states understanding of desired cholesterol values and is compliant with medications prescribed. Participant is following exercise prescription and  nutrition guidelines.;Long Term: Cholesterol controlled with medications as prescribed, with individualized exercise RX and with personalized nutrition plan. Value goals: LDL < 61m, HDL > 40 mg.       Core  Components/Risk Factors/Patient Goals Review:  Goals and Risk Factor Review    Row Name 12/31/17 0809 01/26/18 1110 01/28/18 0744 01/28/18 0839       Core Components/Risk Factors/Patient Goals Review   Personal Goals Review  Weight Management/Obesity;Tobacco Cessation;Hypertension;Diabetes;Lipids  Weight Management/Obesity;Lipids;Tobacco Cessation;Diabetes;Hypertension  -  Improve shortness of breath with ADL's    Review  Frank's weight has been staying steady.  He continues to slowly cut back on his tobacco use. He is trying to cut back. His blood sugars are improving and he checks them twice a day.  His blood pressures are doing good in class.  He does not check it at home as he does not have a cuff at home.  He is doing well with his medication and tolerating the  entresto fairly well.   FPilar Platehas trouble gaining weight. He attributes that to his Diabetes medications. He checks his blood sugar levels in the morning and at night. He reports that they run between 106-1221mdL. He reports that he has cut down use of his pouches (tobacco). He does not have a way to monitor blood pressure at home but can go to [facility] to get it checked for free when needed. I recommended that even after he graduates our program to come down from the [employer] get it checked periodically. He reports that he takes all of his meds as prescribed including his cholesterol medicine.  FrPilar Plateeports his blood sugars every am ar4e about 102-120 and was 118 this am. Glycernia shakes help. STill using tobacco "pouches" like he did when playin g softball. Sent back his Fit bit since he has a paPsychologist, sport and exercise  FrPilar Plates doing well with no shortness of breath on the treadmill.     Expected Outcomes  Short: Continue to work on [facility]; Continue to monitor risk factors.    Short: Continue to cut down on tobacco use, take meds as prescribed, check blood sugars, and increase food intake within Diabetic  diet recommendations. Long: Stop using tobacco and manage risk factors.  Cut back on tobacco use.  -       Core Components/Risk Factors/Patient Goals at Discharge (Final Review):  Goals and Risk Factor Review - 01/28/18 0839      Core Components/Risk Factors/Patient Goals Review   Personal Goals Review  Improve shortness of breath with ADL's    Review  FrPilar Plates doing well with no shortness of breath on the treadmill.        ITP Comments: ITP Comments    Row Name 12/14/17 1337 12/23/17 0556 01/19/18 0623 02/17/18 0933     ITP Comments  Medical evaluation completed today. ITP sent to Dr M Loleta Chanceor review,changes as needed and signature. [facility]  30 day review. Continue with ITP unless direccted changes per Medical Director Chart Review. New to  program  30 Day Review. Continue with ITP unless directed changes per Medical Director review  New to program  30 Day Review. Continue with ITP unless directed changes per Medical Director review.       Comments: 30 day review

## 2018-02-18 ENCOUNTER — Encounter: Payer: Medicare Other | Admitting: *Deleted

## 2018-02-18 DIAGNOSIS — I5022 Chronic systolic (congestive) heart failure: Secondary | ICD-10-CM

## 2018-02-18 NOTE — Progress Notes (Signed)
Daily Session Note  Patient Details  Name: STEN DEMATTEO MRN: [id number] Date of Birth: [birth year] Referring Provider:     Cardiac Rehab from 12/14/2017 in [facility] Cardiac and Pulmonary Rehab  Referring Provider  [name] MD      Encounter Date: 02/18/2018  Check In: Session Check In - 02/18/18 0800      Check-In   Supervising physician immediately available to respond to emergencies  See telemetry face sheet for immediately available ER MD    Location  [facility]-Cardiac & Pulmonary Rehab    Staff Present  Jasper Loser BS, Exercise Physiologist;Carroll Enterkin, RN, BSN    Medication changes reported      No    Fall or balance concerns reported     No    Resistance Training Performed  Yes    VAD Patient?  No    PAD/SET Patient?  No      Pain Assessment   Currently in Pain?  No/denies          Social History   Tobacco Use  Smoking Status Former Smoker  . Packs/day: 3.00  . Years: 60.00  . Pack years: 180.00  Smokeless Tobacco Current User  . Types: Chew  Tobacco Comment   12/12  Down to three pouches a day.    Goals Met:  Independence with exercise equipment Exercise tolerated well No report of cardiac concerns or symptoms Strength training completed today  Goals Unmet:  Not Applicable  Comments: Pt able to follow exercise prescription today without complaint.  Will continue to monitor for progression.    Dr. Emily Filbert is Medical Director for [place] [name].

## 2018-02-23 ENCOUNTER — Encounter: Payer: Medicare Other | Attending: Cardiology | Admitting: *Deleted

## 2018-02-23 DIAGNOSIS — I5022 Chronic systolic (congestive) heart failure: Secondary | ICD-10-CM | POA: Diagnosis not present

## 2018-02-23 DIAGNOSIS — I10 Essential (primary) hypertension: Secondary | ICD-10-CM | POA: Insufficient documentation

## 2018-02-23 DIAGNOSIS — Z79899 Other long term (current) drug therapy: Secondary | ICD-10-CM | POA: Diagnosis not present

## 2018-02-23 DIAGNOSIS — Z794 Long term (current) use of insulin: Secondary | ICD-10-CM | POA: Insufficient documentation

## 2018-02-23 DIAGNOSIS — Z720 Tobacco use: Secondary | ICD-10-CM | POA: Insufficient documentation

## 2018-02-23 DIAGNOSIS — E119 Type 2 diabetes mellitus without complications: Secondary | ICD-10-CM | POA: Diagnosis not present

## 2018-02-23 NOTE — Patient Instructions (Signed)
Discharge Patient Instructions  Patient Details  Name: Carl Chen MRN: [id number] Date of Birth: [birth year] Referring Provider:  [place] Pink, MD   Number of Visits: 36  Reason for Discharge:  Patient reached a stable level of exercise. Patient independent in their exercise. Patient has met program and personal goals.  Smoking History:  Social History   Tobacco Use  Smoking Status Former Smoker  . Packs/day: 3.00  . Years: 60.00  . Pack years: 180.00  Smokeless Tobacco Current User  . Types: Chew  Tobacco Comment   12/12  Down to three pouches a day.    Diagnosis:  Heart failure, chronic systolic (HCC)  Initial Exercise Prescription: Initial Exercise Prescription - 12/14/17 1400      Date of Initial Exercise RX and Referring Provider   Date  12/14/17    Referring Provider  Carl Novas MD      Treadmill   MPH  2.5    Grade  0.5    Minutes  15    METs  3.09      NuStep   Level  3    SPM  80    Minutes  15    METs  3      REL-XR   Level  2    Speed  50    Minutes  15    METs  3      Prescription Details   Frequency (times per week)  2    Duration  Progress to 30 minutes of continuous aerobic without signs/symptoms of physical distress      Intensity   THRR 40-80% of Max Heartrate  104-131    Ratings of Perceived Exertion  11-13    Perceived Dyspnea  0-4      Progression   Progression  Continue to progress workloads to maintain intensity without signs/symptoms of physical distress.      Resistance Training   Training Prescription  Yes    Weight  4 lbs    Reps  10-15       Discharge Exercise Prescription (Final Exercise Prescription Changes): Exercise Prescription Changes - 02/09/18 1300      Response to Exercise   Blood Pressure (Admit)  120/78    Blood Pressure (Exercise)  124/80    Blood Pressure (Exit)  110/80    Heart Rate (Admit)  84 bpm    Heart Rate (Exercise)  108 bpm    Heart Rate (Exit)  82 bpm    Rating of Perceived  Exertion (Exercise)  12    Symptoms  none    Duration  Continue with 30 min of aerobic exercise without signs/symptoms of physical distress.    Intensity  THRR unchanged      Progression   Progression  Continue to progress workloads to maintain intensity without signs/symptoms of physical distress.    Average METs  3.89      Resistance Training   Training Prescription  Yes    Weight  7 lbs    Reps  10-15      Interval Training   Interval Training  No      Treadmill   MPH  3.3    Grade  1    Minutes  15    METs  3.89      NuStep   Level  4    Minutes  15    METs  3.8      REL-XR   Level  6  Minutes  15    METs  3.9      Home Exercise Plan   Plans to continue exercise at  Home (comment)   walk   Frequency  Add 3 additional days to program exercise sessions.    Initial Home Exercises Provided  12/24/17       Functional Capacity: 6 Minute Walk    Row Name 12/14/17 1411 02/16/18 0837       6 Minute Walk   Phase  Initial  Discharge    Distance  1360 feet  1576 feet    Distance % Change  -  15.9 %    Distance Feet Change  -  216 ft    Walk Time  6 minutes  6 minutes    # of Rest Breaks  0  0    MPH  2.58  2.98    METS  3.05  3.34    RPE  9  9    VO2 Peak  10.67  11.68    Symptoms  No  No    Resting HR  76 bpm  96 bpm    Resting BP  126/74  104/58    Resting Oxygen Saturation   97 %  -    Exercise Oxygen Saturation  during 6 min walk  96 %  95 %    Max Ex. HR  80 bpm  93 bpm    Max Ex. BP  146/70  106/60    2 Minute Post BP  126/62  -       Quality of Life: Quality of Life - 02/23/18 0813      Quality of Life Scores   Health/Function Pre  25.61 %    Health/Function Post  26.36 %    Health/Function % Change  2.93 %    Socioeconomic Pre  23.57 %    Socioeconomic Post  25.25 %    Socioeconomic % Change   7.13 %    Psych/Spiritual Pre  23.57 %    Psych/Spiritual Post  23.57 %    Psych/Spiritual % Change  0 %    Family Pre  25.63 %    Family Post   30 %    Family % Change  17.05 %    GLOBAL Pre  24.72 %    GLOBAL Post  25.98 %    GLOBAL % Change  5.1 %       Personal Goals: Goals established at orientation with interventions provided to work toward goal. Personal Goals and Risk Factors at Admission - 12/14/17 1357      Core Components/Risk Factors/Patient Goals on Admission    Weight Management  Yes;Weight Gain    Intervention  Weight Management: Develop a combined nutrition and exercise program designed to reach desired caloric intake, while maintaining appropriate intake of nutrient and fiber, sodium and fats, and appropriate energy expenditure required for the weight goal.;Weight Management: Provide education and appropriate resources to help participant work on and attain dietary goals.    Admit Weight  158 lb 1.6 oz (71.7 kg)    Goal Weight: Short Term  160 lb (72.6 kg)    Goal Weight: Long Term  165 lb (74.8 kg)    Expected Outcomes  Short Term: Continue to assess and modify interventions until short term weight is achieved;Long Term: Adherence to nutrition and physical activity/exercise program aimed toward attainment of established weight goal;Weight Gain: Understanding of general recommendations for a high calorie, high protein meal  plan that promotes weight gain by distributing calorie intake throughout the day with the consumption for 4-5 meals, snacks, and/or supplements   Carl Chen has lost about 20 lbs and his doctor has asked him to gain some weight.   Tobacco Cessation  Yes   Reviewed steps toward quitting with Carl Chen today. He does not state that he is ready to quit.    Number of packs per day  uses tobacco pouches about 3  a day    Intervention  Assist the participant in steps to quit. Provide individualized education and counseling about committing to Tobacco Cessation, relapse prevention, and pharmacological support that can be provided by physician.;Advice worker, assist with locating and accessing  local/national Quit Smoking programs, and support quit date choice.    Expected Outcomes  Short Term: Will demonstrate readiness to quit, by selecting a quit date.;Short Term: Will quit all tobacco product use, adhering to prevention of relapse plan.;Long Term: Complete abstinence from all tobacco products for at least 12 months from quit date.    Diabetes  Yes    Intervention  Provide education about signs/symptoms and action to take for hypo/hyperglycemia.;Provide education about proper nutrition, including hydration, and aerobic/resistive exercise prescription along with prescribed medications to achieve blood glucose in normal ranges: Fasting glucose 65-99 mg/dL    Expected Outcomes  Short Term: Participant verbalizes understanding of the signs/symptoms and immediate care of hyper/hypoglycemia, proper foot care and importance of medication, aerobic/resistive exercise and nutrition plan for blood glucose control.;Long Term: Attainment of HbA1C < 7%.    Heart Failure  Yes   Carl Chen does not have scales at home.  Will check to see if we can get him some scales.    Intervention  Provide a combined exercise and nutrition program that is supplemented with education, support and counseling about heart failure. Directed toward relieving symptoms such as shortness of breath, decreased exercise tolerance, and extremity edema.    Expected Outcomes  Improve functional capacity of life;Short term: Attendance in program 2-3 days a week with increased exercise capacity. Reported lower sodium intake. Reported increased fruit and vegetable intake. Reports medication compliance.;Short term: Daily weights obtained and reported for increase. Utilizing diuretic protocols set by physician.;Long term: Adoption of self-care skills and reduction of barriers for early signs and symptoms recognition and intervention leading to self-care maintenance.    Lipids  Yes    Intervention  Provide education and support for participant on  nutrition & aerobic/resistive exercise along with prescribed medications to achieve LDL <33m, HDL >449m    Expected Outcomes  Short Term: Participant states understanding of desired cholesterol values and is compliant with medications prescribed. Participant is following exercise prescription and nutrition guidelines.;Long Term: Cholesterol controlled with medications as prescribed, with individualized exercise RX and with personalized nutrition plan. Value goals: LDL < 7055mHDL > 40 mg.        Personal Goals Discharge: Goals and Risk Factor Review - 01/28/18 0839      Core Components/Risk Factors/Patient Goals Review   Personal Goals Review  Improve shortness of breath with ADL's    Review  FraPilar Chen doing well with no shortness of breath on the treadmill.        Exercise Goals and Review: Exercise Goals    Row Name 12/14/17 1414             Exercise Goals   Increase Physical Activity  Yes       Intervention  Provide advice, education, support and counseling  about physical activity/exercise needs.;Develop an individualized exercise prescription for aerobic and resistive training based on initial evaluation findings, risk stratification, comorbidities and participant's personal goals.       Expected Outcomes  Short Term: Attend rehab on a regular basis to increase amount of physical activity.;Long Term: Exercising regularly at least 3-5 days a week.;Long Term: Add in home exercise to make exercise part of routine and to increase amount of physical activity.       Increase Strength and Stamina  Yes       Intervention  Provide advice, education, support and counseling about physical activity/exercise needs.;Develop an individualized exercise prescription for aerobic and resistive training based on initial evaluation findings, risk stratification, comorbidities and participant's personal goals.       Expected Outcomes  Short Term: Increase workloads from initial exercise prescription for  resistance, speed, and METs.;Short Term: Perform resistance training exercises routinely during rehab and add in resistance training at home;Long Term: Improve cardiorespiratory fitness, muscular endurance and strength as measured by increased METs and functional capacity (6MWT)       Able to understand and use rate of perceived exertion (RPE) scale  Yes       Intervention  Provide education and explanation on how to use RPE scale       Expected Outcomes  Short Term: Able to use RPE daily in rehab to express subjective intensity level;Long Term:  Able to use RPE to guide intensity level when exercising independently       Knowledge and understanding of Target Heart Rate Range (THRR)  Yes       Intervention  Provide education and explanation of THRR including how the numbers were predicted and where they are located for reference       Expected Outcomes  Short Term: Able to state/look up THRR;Short Term: Able to use daily as guideline for intensity in rehab;Long Term: Able to use THRR to govern intensity when exercising independently       Able to check pulse independently  Yes       Intervention  Provide education and demonstration on how to check pulse in carotid and radial arteries.;Review the importance of being able to check your own pulse for safety during independent exercise       Expected Outcomes  Short Term: Able to explain why pulse checking is important during independent exercise;Long Term: Able to check pulse independently and accurately       Understanding of Exercise Prescription  Yes       Intervention  Provide education, explanation, and written materials on patient's individual exercise prescription       Expected Outcomes  Short Term: Able to explain program exercise prescription;Long Term: Able to explain home exercise prescription to exercise independently          Exercise Goals Re-Evaluation: Exercise Goals Re-Evaluation    Row Name 12/22/17 0810 12/24/17 0837 12/29/17 0943  12/31/17 0807 01/11/18 1619     Exercise Goal Re-Evaluation   Exercise Goals Review  Increase Physical Activity;Increase Strength and Stamina;Able to understand and use rate of perceived exertion (RPE) scale;Knowledge and understanding of Target Heart Rate Range (THRR);Understanding of Exercise Prescription  Increase Physical Activity;Increase Strength and Stamina;Able to understand and use rate of perceived exertion (RPE) scale;Knowledge and understanding of Target Heart Rate Range (THRR);Able to check pulse independently;Understanding of Exercise Prescription  Increase Physical Activity;Increase Strength and Stamina;Able to understand and use rate of perceived exertion (RPE) scale;Knowledge and understanding of Target Heart Rate  Range (THRR);Able to check pulse independently;Understanding of Exercise Prescription  Increase Physical Activity;Increase Strength and Stamina;Understanding of Exercise Prescription  Increase Physical Activity;Increase Strength and Stamina;Understanding of Exercise Prescription   Comments  Reviewed RPE scale, THR and program prescription with pt today.  Pt voiced understanding and was given a copy of goals to take home.   Carl Chen already walks for exercise on days not at [facility].  He will check HR and add some stretching and strength work to walking.  Reviewed home exercise with pt today.  Pt plans to walk at home for exercise.  Reviewed THR, pulse, RPE, sign and symptoms, NTG use, and when to call 911 or MD.  Also discussed weather considerations and indoor options.  Pt voiced understanding.  Carl Chen is doing well in rehab.  He has enjoyed coming and has not been getting out with the the cold as much. He is already interested in [employer].  He is starting to notice that his strength and stamina are coming back.   Cecille Rubin continues to do well in rehab.  He is up to level 5 on the XR and 3.0 mph on the treadmill.  We will continue to monitor his progression.    Expected Outcomes  Short: Use  RPE daily to regulate intensity. Long: Follow program prescription in THR.  Short - check HR while walking and add strength work Long - mainain exercise on his own  Short: Start walking more at home. Long: Continue to exercise indpendently  Short: Continue to attend rehab regularly.  Long: Continue to increase strength and stamina.   Short: Continue to increase workloads to reach RPE of 11-13.  Long: Continue to exercise on off days.    [place] Name 01/26/18 1103 02/09/18 1313           Exercise Goal Re-Evaluation   Exercise Goals Review  Increase Physical Activity;Able to understand and use rate of perceived exertion (RPE) scale;Knowledge and understanding of Target Heart Rate Range (THRR);Understanding of Exercise Prescription;Increase Strength and Stamina;Able to check pulse independently  Increase Physical Activity;Increase Strength and Stamina;Understanding of Exercise Prescription      Comments  Carl Chen continues to increase levels on equipment and tolerate the increase well. He is up to level 6 on the XR. He can not increase weight in resistance training due to rotator cuff issues.   Carl Chen has been doing well in rehab.  He is up to 3.3 mph on the treadmill.  We will continue to monitor his progress.       Expected Outcomes  Short: Continue to increase workloads and exercise at home. Long: Join the [place] to continue exercise after graduation.  Short: Increase workload on NuStep.  Long: Continue to increase strength and stamina         Nutrition & Weight - Outcomes: Pre Biometrics - 12/14/17 1415      Pre Biometrics   Height  5' 11.1" (1.806 m)    Weight  158 lb 1.6 oz (71.7 kg)    Waist Circumference  32 inches    Hip Circumference  38 inches    Waist to Hip Ratio  0.84 %    BMI (Calculated)  21.99    Single Leg Stand  15.3 seconds      Post Biometrics - 02/16/18 0836       Post  Biometrics   Height  5' 11.1" (1.806 m)    Weight  153 lb (69.4 kg)    Waist Circumference  32 inches  Hip Circumference  38 inches    Waist to Hip Ratio  0.84 %    BMI (Calculated)  21.28    Single Leg Stand  23.46 seconds       Nutrition: Nutrition Therapy & Goals - 12/22/17 0857      Nutrition Therapy   Diet  DM    Protein (specify units)  12oz    Fiber  30 grams    Whole Grain Foods  3 servings   does not currently choose whole grain options   Saturated Fats  16 max. grams    Fruits and Vegetables  5 servings/day   8 ideal; likely not meeting recommended daily intake of fruits, eats mostly starchy vegetables   Sodium  1500 grams      Personal Nutrition Goals   Nutrition Goal  Eat at least one additional serving of fruit per week    Personal Goal #2  Because you desire weight gain but do not typically eat 3 meals/day or eat large portions at meal times, it may be helpful to be more consistent about drinking 1-2 Atkins/ Boost/ Glucerna shakes daily, and/or adding in an evening snack regularly    Personal Goal #3  Before coming to class for exercise, try to consume something small such as a granola bar rather than nothing    Comments  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 He reads labels to choose items that are low in sugar and sodium and tries to eat "lots of greens." Drinks sugar free beverages as well. He does not typically eat breakfast and may have a small dinner; lunch is his main meal. He will occasionall eat a granola bar or drink an Atkins shake in the mornings. Lunch/ Dinner: burger + fries, salad, sandwich. Does not snack often but typically chooses PB crackers if so. Eats canned soups regularly. He does not choose whole wheat products at this time. Dairy foods consumed: cheese.       Intervention Plan   Intervention  Prescribe, educate and counsel regarding individualized specific dietary modifications aiming towards targeted core components such as weight, hypertension, lipid management, diabetes, heart failure and other comorbidities.    Expected Outcomes   Short Term Goal: Understand basic principles of dietary content, such as calories, fat, sodium, cholesterol and nutrients.;Short Term Goal: A plan has been developed with personal nutrition goals set during dietitian appointment.;Long Term Goal: Adherence to prescribed nutrition plan.       Nutrition Discharge: Nutrition Assessments - 02/23/18 0813      MEDFICTS Scores   Pre Score  6    Post Score  30    Score Difference  24       Education Questionnaire Score: Knowledge Questionnaire Score - 02/23/18 0813      Knowledge Questionnaire Score   Post Score  23/26   test reviewed with pt      Goals reviewed with patient; copy given to patient.

## 2018-02-23 NOTE — Progress Notes (Signed)
Daily Session Note  Patient Details  Name: Carl Chen MRN: [id number] Date of Birth: [birth year] Referring Provider:     Cardiac Rehab from 12/14/2017 in [facility] Cardiac and Pulmonary Rehab  Referring Provider  [name] MD      Encounter Date: 02/23/2018  Check In: Session Check In - 02/23/18 0753      Check-In   Supervising physician immediately available to respond to emergencies  See telemetry face sheet for immediately available ER MD    Location  [facility]-Cardiac & Pulmonary Rehab    Staff Present  Jasper Loser BS, Exercise Physiologist;Amanda Oletta Darter, BA, ACSM CEP, Exercise Physiologist;Susanne Bice, RN, BSN, CCRP    Medication changes reported      No    Fall or balance concerns reported     No    Warm-up and Cool-down  Performed as group-led instruction    Resistance Training Performed  Yes    VAD Patient?  No    PAD/SET Patient?  No      Pain Assessment   Currently in Pain?  No/denies          Social History   Tobacco Use  Smoking Status Former Smoker  . Packs/day: 3.00  . Years: 60.00  . Pack years: 180.00  Smokeless Tobacco Current User  . Types: Chew  Tobacco Comment   12/12  Down to three pouches a day.    Goals Met:  Independence with exercise equipment Exercise tolerated well No report of cardiac concerns or symptoms Strength training completed today  Goals Unmet:  Not Applicable  Comments: Pt able to follow exercise prescription today without complaint.  Will continue to monitor for progression.    Dr. Emily Filbert is Medical Director for [place] and [name].

## 2018-03-02 ENCOUNTER — Encounter: Payer: Medicare Other | Admitting: *Deleted

## 2018-03-02 DIAGNOSIS — I5022 Chronic systolic (congestive) heart failure: Secondary | ICD-10-CM | POA: Diagnosis not present

## 2018-03-02 NOTE — Progress Notes (Signed)
Daily Session Note  Patient Details  Name: Carl Chen MRN: [id number] Date of Birth: 1940-02-07 Referring Provider:     Cardiac Rehab from 12/14/2017 in [facility] Cardiac and Pulmonary Rehab  Referring Provider  [name] MD      Encounter Date: 03/02/2018  Check In: Session Check In - 03/02/18 0757      Check-In   Supervising physician immediately available to respond to emergencies  See telemetry face sheet for immediately available ER MD    Location  [facility]-Cardiac & Pulmonary Rehab    Staff Present  Heath Lark, RN, BSN, CCRP;Jeanna Durrell BS, Exercise Physiologist;Zakkary Thibault Teresita, MA, RCEP, CCRP, Exercise Physiologist    Medication changes reported      No    Fall or balance concerns reported     No    Warm-up and Cool-down  Performed as group-led instruction    Resistance Training Performed  Yes    VAD Patient?  No    PAD/SET Patient?  No      Pain Assessment   Currently in Pain?  No/denies          Social History   Tobacco Use  Smoking Status Former Smoker  . Packs/day: 3.00  . Years: 60.00  . Pack years: 180.00  Smokeless Tobacco Current User  . Types: Chew  Tobacco Comment   12/12  Down to three pouches a day.    Goals Met:  Independence with exercise equipment Exercise tolerated well Personal goals reviewed No report of cardiac concerns or symptoms Strength training completed today  Goals Unmet:  Not Applicable  Comments:  Carl Chen graduated today from  rehab with 36 sessions completed.  Details of the patient's exercise prescription and what He needs to do in order to continue the prescription and progress were discussed with patient.  Patient was given a copy of prescription and goals.  Patient verbalized understanding.  Carl Chen plans to continue to exercise by by joining the [employer].    Dr. Emily Filbert is Medical Director for [place] and [name].

## 2018-03-02 NOTE — Progress Notes (Signed)
Discharge Progress Report  Patient Details  Name: Carl Chen MRN: [id number] Date of Birth: 1940/10/20 Referring Provider:     Cardiac Rehab from 12/14/2017 in [facility]  Referring Provider  [name] MD       Number of Visits: 36  Reason for Discharge:  Patient reached a stable level of exercise. Patient independent in their exercise. Patient has met program and personal goals.  Smoking History:  Social History   Tobacco Use  Smoking Status Former Smoker  . Packs/day: 3.00  . Years: 60.00  . Pack years: 180.00  Smokeless Tobacco Current User  . Types: Chew  Tobacco Comment   12/12  Down to three pouches a day.    Diagnosis:  Heart failure, chronic systolic (HCC)  ADL UCSD:   Initial Exercise Prescription: Initial Exercise Prescription - 12/14/17 1400      Date of Initial Exercise RX and Referring Provider   Date  12/14/17    Referring Provider  [name] MD      Treadmill   MPH  2.5    Grade  0.5    Minutes  15    METs  3.09      NuStep   Level  3    SPM  80    Minutes  15    METs  3      REL-XR   Level  2    Speed  50    Minutes  15    METs  3      Prescription Details   Frequency (times per week)  2    Duration  Progress to 30 minutes of continuous aerobic without signs/symptoms of physical distress      Intensity   THRR 40-80% of Max Heartrate  104-131    Ratings of Perceived Exertion  11-13    Perceived Dyspnea  0-4      Progression   Progression  Continue to progress workloads to maintain intensity without signs/symptoms of physical distress.      Resistance Training   Training Prescription  Yes    Weight  4 lbs    Reps  10-15       Discharge Exercise Prescription (Final Exercise Prescription Changes): Exercise Prescription Changes - 02/09/18 1300      Response to Exercise   Blood Pressure (Admit)  120/78    Blood Pressure (Exercise)  124/80    Blood Pressure (Exit)  110/80    Heart Rate  (Admit)  84 bpm    Heart Rate (Exercise)  108 bpm    Heart Rate (Exit)  82 bpm    Rating of Perceived Exertion (Exercise)  12    Symptoms  none    Duration  Continue with 30 min of aerobic exercise without signs/symptoms of physical distress.    Intensity  THRR unchanged      Progression   Progression  Continue to progress workloads to maintain intensity without signs/symptoms of physical distress.    Average METs  3.89      Resistance Training   Training Prescription  Yes    Weight  7 lbs    Reps  10-15      Interval Training   Interval Training  No      Treadmill   MPH  3.3    Grade  1    Minutes  15    METs  3.89      NuStep   Level  4  Minutes  15    METs  3.8      REL-XR   Level  6    Minutes  15    METs  3.9      Home Exercise Plan   Plans to continue exercise at  Home (comment)   walk   Frequency  Add 3 additional days to program exercise sessions.    Initial Home Exercises Provided  12/24/17       Functional Capacity: 6 Minute Walk    Row Name 12/14/17 1411 02/16/18 0837       6 Minute Walk   Phase  Initial  Discharge    Distance  1360 feet  1576 feet    Distance % Change  -  15.9 %    Distance Feet Change  -  216 ft    Walk Time  6 minutes  6 minutes    # of Rest Breaks  0  0    MPH  2.58  2.98    METS  3.05  3.34    RPE  9  9    VO2 Peak  10.67  11.68    Symptoms  No  No    Resting HR  76 bpm  96 bpm    Resting BP  126/74  104/58    Resting Oxygen Saturation   97 %  -    Exercise Oxygen Saturation  during 6 min walk  96 %  95 %    Max Ex. HR  80 bpm  93 bpm    Max Ex. BP  146/70  106/60    2 Minute Post BP  126/62  -       Psychological, QOL, Others - Outcomes: PHQ 2/9: Depression screen [facility] 2/9 02/23/2018 12/14/2017  Decreased Interest 0 0  Down, Depressed, Hopeless 0 0  PHQ - 2 Score 0 0  Altered sleeping 0 0  Tired, decreased energy 0 0  Change in appetite 0 0  Feeling bad or failure about yourself  0 0  Trouble  concentrating 0 0  Moving slowly or fidgety/restless 0 0  Suicidal thoughts 0 0  PHQ-9 Score 0 0  Difficult doing work/chores Not difficult at all Not difficult at all    Quality of Life: Quality of Life - 02/23/18 0813      Quality of Life Scores   Health/Function Pre  25.61 %    Health/Function Post  26.36 %    Health/Function % Change  2.93 %    Socioeconomic Pre  23.57 %    Socioeconomic Post  25.25 %    Socioeconomic % Change   7.13 %    Psych/Spiritual Pre  23.57 %    Psych/Spiritual Post  23.57 %    Psych/Spiritual % Change  0 %    Family Pre  25.63 %    Family Post  30 %    Family % Change  17.05 %    GLOBAL Pre  24.72 %    GLOBAL Post  25.98 %    GLOBAL % Change  5.1 %       Personal Goals: Goals established at orientation with interventions provided to work toward goal. Personal Goals and Risk Factors at Admission - 12/14/17 1357      Core Components/Risk Factors/Patient Goals on Admission    Weight Management  Yes;Weight Gain    Intervention  Weight Management: Develop a combined nutrition and exercise program designed to reach desired caloric intake,  while maintaining appropriate intake of nutrient and fiber, sodium and fats, and appropriate energy expenditure required for the weight goal.;Weight Management: Provide education and appropriate resources to help participant work on and attain dietary goals.    Admit Weight  158 lb 1.6 oz (71.7 kg)    Goal Weight: Short Term  160 lb (72.6 kg)    Goal Weight: Long Term  165 lb (74.8 kg)    Expected Outcomes  Short Term: Continue to assess and modify interventions until short term weight is achieved;Long Term: Adherence to nutrition and physical activity/exercise program aimed toward attainment of established weight goal;Weight Gain: Understanding of general recommendations for a high calorie, high protein meal plan that promotes weight gain by distributing calorie intake throughout the day with the consumption for 4-5  meals, snacks, and/or supplements   Carl Chen has lost about 20 lbs and his doctor has asked him to gain some weight.   Tobacco Cessation  Yes   Reviewed steps toward quitting with Carl Chen today. He does not state that he is ready to quit.    Number of packs per day  uses tobacco pouches about 3  a day    Intervention  Assist the participant in steps to quit. Provide individualized education and counseling about committing to Tobacco Cessation, relapse prevention, and pharmacological support that can be provided by physician.;Advice worker, assist with locating and accessing local/national Quit Smoking programs, and support quit date choice.    Expected Outcomes  Short Term: Will demonstrate readiness to quit, by selecting a quit date.;Short Term: Will quit all tobacco product use, adhering to prevention of relapse plan.;Long Term: Complete abstinence from all tobacco products for at least 12 months from quit date.    Diabetes  Yes    Intervention  Provide education about signs/symptoms and action to take for hypo/hyperglycemia.;Provide education about proper nutrition, including hydration, and aerobic/resistive exercise prescription along with prescribed medications to achieve blood glucose in normal ranges: Fasting glucose 65-99 mg/dL    Expected Outcomes  Short Term: Participant verbalizes understanding of the signs/symptoms and immediate care of hyper/hypoglycemia, proper foot care and importance of medication, aerobic/resistive exercise and nutrition plan for blood glucose control.;Long Term: Attainment of HbA1C < 7%.    Heart Failure  Yes   Carl Chen does not have scales at home.  Will check to see if we can get him some scales.    Intervention  Provide a combined exercise and nutrition program that is supplemented with education, support and counseling about heart failure. Directed toward relieving symptoms such as shortness of breath, decreased exercise tolerance, and extremity edema.     Expected Outcomes  Improve functional capacity of life;Short term: Attendance in program 2-3 days a week with increased exercise capacity. Reported lower sodium intake. Reported increased fruit and vegetable intake. Reports medication compliance.;Short term: Daily weights obtained and reported for increase. Utilizing diuretic protocols set by physician.;Long term: Adoption of self-care skills and reduction of barriers for early signs and symptoms recognition and intervention leading to self-care maintenance.    Lipids  Yes    Intervention  Provide education and support for participant on nutrition & aerobic/resistive exercise along with prescribed medications to achieve LDL <71m, HDL >460m    Expected Outcomes  Short Term: Participant states understanding of desired cholesterol values and is compliant with medications prescribed. Participant is following exercise prescription and nutrition guidelines.;Long Term: Cholesterol controlled with medications as prescribed, with individualized exercise RX and with personalized nutrition plan. Value goals: LDL <  18m, HDL > 40 mg.        Personal Goals Discharge: Goals and Risk Factor Review    Row Name 12/31/17 0809 01/26/18 1110 01/28/18 0744 01/28/18 0839       Core Components/Risk Factors/Patient Goals Review   Personal Goals Review  Weight Management/Obesity;Tobacco Cessation;Hypertension;Diabetes;Lipids  Weight Management/Obesity;Lipids;Tobacco Cessation;Diabetes;Hypertension  -  Improve shortness of breath with ADL's    Review  Carl Chen's weight has been staying steady.  He continues to slowly cut back on his tobacco use. He is trying to cut back. His blood sugars are improving and he checks them twice a day.  His blood pressures are doing good in class.  He does not check it at home as he does not have a cuff at home.  He is doing well with his medication and tolerating the  entresto fairly well.   FPilar Platehas trouble gaining weight. He attributes that to  his Diabetes medications. He checks his blood sugar levels in the morning and at night. He reports that they run between 106-1253mdL. He reports that he has cut down use of his pouches (tobacco). He does not have a way to monitor blood pressure at home but can go to [facility] to get it checked for free when needed. I recommended that even after he graduates our program to come down from the [employer] get it checked periodically. He reports that he takes all of his meds as prescribed including his cholesterol medicine.  FrPilar Plateeports his blood sugars every am ar4e about 102-120 and was 118 this am. Glycernia shakes help. STill using tobacco "pouches" like he did when playin g softball. Sent back his Fit bit since he has a paPsychologist, sport and exercise  FrPilar Plates doing well with no shortness of breath on the treadmill.     Expected Outcomes  Short: Continue to work on [facility] Long; Continue to monitor risk factors.    Short: Continue to cut down on tobacco use, take meds as prescribed, check blood sugars, and increase food intake within Diabetic diet recommendations. Long: Stop using tobacco and manage risk factors.  Cut back on tobacco use.  -       Exercise Goals and Review: Exercise Goals    Row Name 12/14/17 1414             Exercise Goals   Increase Physical Activity  Yes       Intervention  Provide advice, education, support and counseling about physical activity/exercise needs.;Develop an individualized exercise prescription for aerobic and resistive training based on initial evaluation findings, risk stratification, comorbidities and participant's personal goals.       Expected Outcomes  Short Term: Attend rehab on a regular basis to increase amount of physical activity.;Long Term: Exercising regularly at least 3-5 days a week.;Long Term: Add in home exercise to make exercise part of routine and to increase amount of physical activity.       Increase Strength and Stamina   Yes       Intervention  Provide advice, education, support and counseling about physical activity/exercise needs.;Develop an individualized exercise prescription for aerobic and resistive training based on initial evaluation findings, risk stratification, comorbidities and participant's personal goals.       Expected Outcomes  Short Term: Increase workloads from initial exercise prescription for resistance, speed, and METs.;Short Term: Perform resistance training exercises routinely during rehab and add in resistance training at home;Long Term: Improve cardiorespiratory fitness, muscular endurance and strength  as measured by increased METs and functional capacity (6MWT)       Able to understand and use rate of perceived exertion (RPE) scale  Yes       Intervention  Provide education and explanation on how to use RPE scale       Expected Outcomes  Short Term: Able to use RPE daily in rehab to express subjective intensity level;Long Term:  Able to use RPE to guide intensity level when exercising independently       Knowledge and understanding of Target Heart Rate Range (THRR)  Yes       Intervention  Provide education and explanation of THRR including how the numbers were predicted and where they are located for reference       Expected Outcomes  Short Term: Able to state/look up THRR;Short Term: Able to use daily as guideline for intensity in rehab;Long Term: Able to use THRR to govern intensity when exercising independently       Able to check pulse independently  Yes       Intervention  Provide education and demonstration on how to check pulse in carotid and radial arteries.;Review the importance of being able to check your own pulse for safety during independent exercise       Expected Outcomes  Short Term: Able to explain why pulse checking is important during independent exercise;Long Term: Able to check pulse independently and accurately       Understanding of Exercise Prescription  Yes        Intervention  Provide education, explanation, and written materials on patient's individual exercise prescription       Expected Outcomes  Short Term: Able to explain program exercise prescription;Long Term: Able to explain home exercise prescription to exercise independently          Exercise Goals Re-Evaluation: Exercise Goals Re-Evaluation    Row Name 12/22/17 0810 12/24/17 0837 12/29/17 0943 12/31/17 0807 01/11/18 1619     Exercise Goal Re-Evaluation   Exercise Goals Review  Increase Physical Activity;Increase Strength and Stamina;Able to understand and use rate of perceived exertion (RPE) scale;Knowledge and understanding of Target Heart Rate Range (THRR);Understanding of Exercise Prescription  Increase Physical Activity;Increase Strength and Stamina;Able to understand and use rate of perceived exertion (RPE) scale;Knowledge and understanding of Target Heart Rate Range (THRR);Able to check pulse independently;Understanding of Exercise Prescription  Increase Physical Activity;Increase Strength and Stamina;Able to understand and use rate of perceived exertion (RPE) scale;Knowledge and understanding of Target Heart Rate Range (THRR);Able to check pulse independently;Understanding of Exercise Prescription  Increase Physical Activity;Increase Strength and Stamina;Understanding of Exercise Prescription  Increase Physical Activity;Increase Strength and Stamina;Understanding of Exercise Prescription   Comments  Reviewed RPE scale, THR and program prescription with pt today.  Pt voiced understanding and was given a copy of goals to take home.   Carl Chen already walks for exercise on days not at [facility].  He will check HR and add some stretching and strength work to walking.  Reviewed home exercise with pt today.  Pt plans to walk at home for exercise.  Reviewed THR, pulse, RPE, sign and symptoms, NTG use, and when to call 911 or MD.  Also discussed weather considerations and indoor options.  Pt voiced understanding.   Carl Chen is doing well in rehab.  He has enjoyed coming and has not been getting out with the the cold as much. He is already interested in [employer].  He is starting to notice that his strength and stamina are  coming back.   Cecille Rubin continues to do well in rehab.  He is up to level 5 on the XR and 3.0 mph on the treadmill.  We will continue to monitor his progression.    Expected Outcomes  Short: Use RPE daily to regulate intensity. Long: Follow program prescription in THR.  Short - check HR while walking and add strength work Long - mainain exercise on his own  Short: Start walking more at home. Long: Continue to exercise indpendently  Short: Continue to attend rehab regularly.  Long: Continue to increase strength and stamina.   Short: Continue to increase workloads to reach RPE of 11-13.  Long: Continue to exercise on off days.    Carl Chen Name 01/26/18 1103 02/09/18 1313           Exercise Goal Re-Evaluation   Exercise Goals Review  Increase Physical Activity;Able to understand and use rate of perceived exertion (RPE) scale;Knowledge and understanding of Target Heart Rate Range (THRR);Understanding of Exercise Prescription;Increase Strength and Stamina;Able to check pulse independently  Increase Physical Activity;Increase Strength and Stamina;Understanding of Exercise Prescription      Comments  Carl Chen continues to increase levels on equipment and tolerate the increase well. He is up to level 6 on the XR. He can not increase weight in resistance training due to rotator cuff issues.   Carl Chen has been doing well in rehab.  He is up to 3.3 mph on the treadmill.  We will continue to monitor his progress.       Expected Outcomes  Short: Continue to increase workloads and exercise at home. Long: Join the [facility] to continue exercise after graduation.  Short: Increase workload on NuStep.  Long: Continue to increase strength and stamina         Nutrition & Weight - Outcomes: Pre Biometrics - 12/14/17 1415       Pre Biometrics   Height  5' 11.1" (1.806 m)    Weight  158 lb 1.6 oz (71.7 kg)    Waist Circumference  32 inches    Hip Circumference  38 inches    Waist to Hip Ratio  0.84 %    BMI (Calculated)  21.99    Single Leg Stand  15.3 seconds      Post Biometrics - 02/16/18 0836       Post  Biometrics   Height  5' 11.1" (1.806 m)    Weight  153 lb (69.4 kg)    Waist Circumference  32 inches    Hip Circumference  38 inches    Waist to Hip Ratio  0.84 %    BMI (Calculated)  21.28    Single Leg Stand  23.46 seconds       Nutrition: Nutrition Therapy & Goals - 12/22/17 0857      Nutrition Therapy   Diet  DM    Protein (specify units)  12oz    Fiber  30 grams    Whole Grain Foods  3 servings   does not currently choose whole grain options   Saturated Fats  16 max. grams    Fruits and Vegetables  5 servings/day   8 ideal; likely not meeting recommended daily intake of fruits, eats mostly starchy vegetables   Sodium  1500 grams      Personal Nutrition Goals   Nutrition Goal  Eat at least one additional serving of fruit per week    Personal Goal #2  Because you desire weight gain but do not typically eat  3 meals/day or eat large portions at meal times, it may be helpful to be more consistent about drinking 1-2 Atkins/ Boost/ Glucerna shakes daily, and/or adding in an evening snack regularly    Personal Goal #3  Before coming to class for exercise, try to consume something small such as a granola bar rather than nothing    Comments  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 He reads labels to choose items that are low in sugar and sodium and tries to eat "lots of greens." Drinks sugar free beverages as well. He does not typically eat breakfast and may have a small dinner; lunch is his main meal. He will occasionall eat a granola bar or drink an Atkins shake in the mornings. Lunch/ Dinner: burger + fries, salad, sandwich. Does not snack often but typically chooses PB crackers if so.  Eats canned soups regularly. He does not choose whole wheat products at this time. Dairy foods consumed: cheese.       Intervention Plan   Intervention  Prescribe, educate and counsel regarding individualized specific dietary modifications aiming towards targeted core components such as weight, hypertension, lipid management, diabetes, heart failure and other comorbidities.    Expected Outcomes  Short Term Goal: Understand basic principles of dietary content, such as calories, fat, sodium, cholesterol and nutrients.;Short Term Goal: A plan has been developed with personal nutrition goals set during dietitian appointment.;Long Term Goal: Adherence to prescribed nutrition plan.       Nutrition Discharge: Nutrition Assessments - 02/23/18 0813      MEDFICTS Scores   Pre Score  6    Post Score  30    Score Difference  24       Education Questionnaire Score: Knowledge Questionnaire Score - 02/23/18 0813      Knowledge Questionnaire Score   Post Score  23/26   test reviewed with pt      Goals reviewed with patient; copy given to patient.

## 2018-03-02 NOTE — Progress Notes (Signed)
Cardiac Individual Treatment Plan  Patient Details  Name: Carl Chen MRN: [id number] Date of Birth: [birth year] Referring Provider:     Cardiac Rehab from 12/14/2017 in [facility] Cardiac and Pulmonary Rehab  Referring Provider  [name] MD      Initial Encounter Date:    Cardiac Rehab from 12/14/2017 in [facility] Cardiac and Pulmonary Rehab  Date  12/14/17      Visit Diagnosis: Heart failure, chronic systolic ([place])  Patient's Home Medications on Admission:  Current Outpatient Medications:  .  amLODipine (NORVASC) 5 MG tablet, , Disp: , Rfl:  .  amLODipine-olmesartan (AZOR) 5-20 MG per tablet, Take 1 tablet by mouth daily., Disp: , Rfl:  .  Cinnamon 500 MG capsule, Take by mouth., Disp: , Rfl:  .  ENTRESTO 24-26 MG, , Disp: , Rfl:  .  glipiZIDE (GLUCOTROL XL) 5 MG 24 hr tablet, Take 5 mg by mouth daily with breakfast., Disp: , Rfl:  .  Insulin Isophane & Regular Human (HUMULIN 70/30 KWIKPEN) (70-30) 100 UNIT/ML PEN, Inject into the skin., Disp: , Rfl:  .  Investigational - Study Medication, Take 35 mg by mouth daily. Take o each by mouth Daily before dinner 35 mg empaglifozin GQQ/PY19 mg placebo. Study is being conducted by [employer] [place], Disp: , Rfl:  .  losartan (COZAAR) 50 MG tablet, Take 50 mg by mouth daily., Disp: , Rfl:  .  magnesium oxide (MAG-OX) 400 MG tablet, Take by mouth., Disp: , Rfl:  .  metFORMIN (GLUCOPHAGE) 1000 MG tablet, Take 1,000 mg by mouth 2 (two) times daily with a meal., Disp: , Rfl:  .  metoprolol succinate (TOPROL-XL) 25 MG 24 hr tablet, Take by mouth., Disp: , Rfl:  .  nystatin-triamcinolone ointment (MYCOLOG), Apply 1 application topically 2 (two) times daily., Disp: 30 g, Rfl: 0 .  simvastatin (ZOCOR) 20 MG tablet, Take 20 mg by mouth daily., Disp: , Rfl:  .  Urea (URAMAXIN EX), Apply topically., Disp: , Rfl:  .  vitamin B-12 (CYANOCOBALAMIN) 500 MCG tablet, Take by mouth., Disp: , Rfl:   Past Medical History: Past Medical History:   Diagnosis Date  . Diabetes ([place])   . Hypertension     Tobacco Use: Social History   Tobacco Use  Smoking Status Former Smoker  . Packs/day: 3.00  . Years: 60.00  . Pack years: 180.00  Smokeless Tobacco Current User  . Types: Chew  Tobacco Comment   12/12  Down to three pouches a day.    Labs: Recent Review Flowsheet Data    There is no flowsheet data to display.       Exercise Target Goals: Exercise Program Goal: Individual exercise prescription set using results from initial 6 min walk test and THRR while considering  patient's activity barriers and safety.   Exercise Prescription Goal: Initial exercise prescription builds to 30-45 minutes a day of aerobic activity, 2-3 days per week.  Home exercise guidelines will be given to patient during program as part of exercise prescription that the participant will acknowledge.  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 Has had cortisone shots in the past   Cardiac Risk Stratification  High       6 Minute Walk: 6 Minute Walk    Row Name 12/14/17 1411  02/16/18 0837       6 Minute Walk   Phase  Initial  Discharge    Distance  1360 feet  1576 feet    Distance % Change  -  15.9 %    Distance Feet Change  -  216 ft    Walk Time  6 minutes  6 minutes    # of Rest Breaks  0  0    MPH  2.58  2.98    METS  3.05  3.34    RPE  9  9    VO2 Peak  10.67  11.68    Symptoms  No  No    Resting HR  76 bpm  96 bpm    Resting BP  126/74  104/58    Resting Oxygen Saturation   97 %  -    Exercise Oxygen Saturation  during 6 min walk  96 %  95 %    Max Ex. HR  80 bpm  93 bpm    Max Ex. BP  146/70  106/60    2 Minute Post BP  126/62  -       Oxygen Initial Assessment:   Oxygen  Re-Evaluation:   Oxygen Discharge (Final Oxygen Re-Evaluation):   Initial Exercise Prescription: Initial Exercise Prescription - 12/14/17 1400      Date of Initial Exercise RX and Referring Provider   Date  12/14/17    Referring Provider  [name] MD      Treadmill   MPH  2.5    Grade  0.5    Minutes  15    METs  3.09      NuStep   Level  3    SPM  80    Minutes  15    METs  3      REL-XR   Level  2    Speed  50    Minutes  15    METs  3      Prescription Details   Frequency (times per week)  2    Duration  Progress to 30 minutes of continuous aerobic without signs/symptoms of physical distress      Intensity   THRR 40-80% of Max Heartrate  104-131    Ratings of Perceived Exertion  11-13    Perceived Dyspnea  0-4      Progression   Progression  Continue to progress workloads to maintain intensity without signs/symptoms of physical distress.      Resistance Training   Training Prescription  Yes    Weight  4 lbs    Reps  10-15       Perform Capillary Blood Glucose checks as needed.  Exercise Prescription Changes: Exercise Prescription Changes    Row Name 12/14/17 1300 12/24/17 0800 12/29/17 0900 01/11/18 1600 01/26/18 1300     Response to Exercise   Blood Pressure (Admit)  126/74  -  122/74  120/62  110/62   Blood Pressure (Exercise)  146/70  -  136/70  134/70  136/70   Blood Pressure (Exit)  126/62  -  122/74  128/70  104/62   Heart Rate (Admit)  76 bpm  -  66 bpm  80 bpm  81 bpm   Heart Rate (Exercise)  80 bpm  -  104 bpm  103 bpm  97 bpm   Heart Rate (Exit)  75 bpm  -  81 bpm  97 bpm  80 bpm   Oxygen Saturation (  Admit)  97 %  -  -  -  -   Oxygen Saturation (Exercise)  96 %  -  -  -  -   Rating of Perceived Exertion (Exercise)  9  -  _0 Symptoms  none  -  none  none  none   Comments  walk test results  -  -  -  -   Duration  -  -  Continue with 30 min of aerobic exercise without signs/symptoms of physical distress.  Continue with 30 min  of aerobic exercise without signs/symptoms of physical distress.  Continue with 30 min of aerobic exercise without signs/symptoms of physical distress.   Intensity  -  -  THRR unchanged  THRR unchanged  THRR unchanged     Progression   Progression  -  -  Continue to progress workloads to maintain intensity without signs/symptoms of physical distress.  Continue to progress workloads to maintain intensity without signs/symptoms of physical distress.  Continue to progress workloads to maintain intensity without signs/symptoms of physical distress.   Average METs  -  -  3.03  3.63  2.82     Resistance Training   Training Prescription  -  -  Yes  Yes  Yes   Weight  -  -  4 lbs  4 lbs  7 lbs   Reps  -  -  10-15  10-15  10-15     Interval Training   Interval Training  -  -  No  No  No     Treadmill   MPH  -  -  2.5  3  2.7   Grade  -  -  0.5  0.5  1   Minutes  -  -  _1 METs  -  -  3.09  3.5  3.44     NuStep   Level  -  -  4  4  -   Minutes  -  -  15  15  -   METs  -  -  3  2.9  -     REL-XR   Level  -  -  _2 Minutes  -  -  _3 METs  -  -  3  4.5  2.2     Home Exercise Plan   Plans to continue exercise at  -  Home (comment) walk  Home (comment) walk  Home (comment) walk  Home (comment) walk   Frequency  -  Add 3 additional days to program exercise sessions.  Add 3 additional days to program exercise sessions.  Add 3 additional days to program exercise sessions.  Add 3 additional days to program exercise sessions.   Initial Home Exercises Provided  -  12/24/17  12/24/17  12/24/17  12/24/17   Row Name 02/09/18 1300             Response to Exercise   Blood Pressure (Admit)  120/78       Blood Pressure (Exercise)  124/80       Blood Pressure (Exit)  110/80       Heart Rate (Admit)  84 bpm       Heart Rate (Exercise)  108 bpm       Heart Rate (Exit)  82 bpm       Rating of Perceived Exertion (  Exercise)  12       Symptoms  none       Duration  Continue  with 30 min of aerobic exercise without signs/symptoms of physical distress.       Intensity  THRR unchanged         Progression   Progression  Continue to progress workloads to maintain intensity without signs/symptoms of physical distress.       Average METs  3.89         Resistance Training   Training Prescription  Yes       Weight  7 lbs       Reps  10-15         Interval Training   Interval Training  No         Treadmill   MPH  3.3       Grade  1       Minutes  15       METs  3.89         NuStep   Level  4       Minutes  15       METs  3.8         REL-XR   Level  6       Minutes  15       METs  3.9         Home Exercise Plan   Plans to continue exercise at  Home (comment) walk       Frequency  Add 3 additional days to program exercise sessions.       Initial Home Exercises Provided  12/24/17          Exercise Comments: Exercise Comments    Row Name 12/22/17 0809 12/24/17 0837 03/02/18 0758       Exercise Comments   First full day of exercise!  Patient was oriented to gym and equipment including functions, settings, policies, and procedures.  Patient's individual exercise prescription and treatment plan were reviewed.  All starting workloads were established based on the results of the 6 minute walk test done at initial orientation visit.  The plan for exercise progression was also introduced and progression will be customized based on patient's performance and goals.  Reviewed home exercise with pt today.  Pt plans to walk for exercise.  Reviewed THR, pulse, RPE, sign and symptoms, NTG use, and when to call 911 or MD.  Also discussed weather considerations and indoor options.  Pt voiced understanding.  Carl Chen graduated today from  rehab with 36 sessions completed.  Details of the patient's exercise prescription and what He needs to do in order to continue the prescription and progress were discussed with patient.  Patient was given a copy of prescription and goals.   Patient verbalized understanding.  Carl Chen plans to continue to exercise by by joining the [employer].        Exercise Goals and Review: Exercise Goals    Row Name 12/14/17 1414             Exercise Goals   Increase Physical Activity  Yes       Intervention  Provide advice, education, support and counseling about physical activity/exercise needs.;Develop an individualized exercise prescription for aerobic and resistive training based on initial evaluation findings, risk stratification, comorbidities and participant's personal goals.       Expected Outcomes  Short Term: Attend rehab on a regular basis to increase amount of physical activity.;Long Term: Exercising regularly  at least 3-5 days a week.;Long Term: Add in home exercise to make exercise part of routine and to increase amount of physical activity.       Increase Strength and Stamina  Yes       Intervention  Provide advice, education, support and counseling about physical activity/exercise needs.;Develop an individualized exercise prescription for aerobic and resistive training based on initial evaluation findings, risk stratification, comorbidities and participant's personal goals.       Expected Outcomes  Short Term: Increase workloads from initial exercise prescription for resistance, speed, and METs.;Short Term: Perform resistance training exercises routinely during rehab and add in resistance training at home;Long Term: Improve cardiorespiratory fitness, muscular endurance and strength as measured by increased METs and functional capacity (6MWT)       Able to understand and use rate of perceived exertion (RPE) scale  Yes       Intervention  Provide education and explanation on how to use RPE scale       Expected Outcomes  Short Term: Able to use RPE daily in rehab to express subjective intensity level;Long Term:  Able to use RPE to guide intensity level when exercising independently       Knowledge and understanding of Target Heart Rate  Range (THRR)  Yes       Intervention  Provide education and explanation of THRR including how the numbers were predicted and where they are located for reference       Expected Outcomes  Short Term: Able to state/look up THRR;Short Term: Able to use daily as guideline for intensity in rehab;Long Term: Able to use THRR to govern intensity when exercising independently       Able to check pulse independently  Yes       Intervention  Provide education and demonstration on how to check pulse in carotid and radial arteries.;Review the importance of being able to check your own pulse for safety during independent exercise       Expected Outcomes  Short Term: Able to explain why pulse checking is important during independent exercise;Long Term: Able to check pulse independently and accurately       Understanding of Exercise Prescription  Yes       Intervention  Provide education, explanation, and written materials on patient's individual exercise prescription       Expected Outcomes  Short Term: Able to explain program exercise prescription;Long Term: Able to explain home exercise prescription to exercise independently          Exercise Goals Re-Evaluation : Exercise Goals Re-Evaluation    Row Name 12/22/17 0810 12/24/17 0837 12/29/17 0943 12/31/17 0807 01/11/18 1619     Exercise Goal Re-Evaluation   Exercise Goals Review  Increase Physical Activity;Increase Strength and Stamina;Able to understand and use rate of perceived exertion (RPE) scale;Knowledge and understanding of Target Heart Rate Range (THRR);Understanding of Exercise Prescription  Increase Physical Activity;Increase Strength and Stamina;Able to understand and use rate of perceived exertion (RPE) scale;Knowledge and understanding of Target Heart Rate Range (THRR);Able to check pulse independently;Understanding of Exercise Prescription  Increase Physical Activity;Increase Strength and Stamina;Able to understand and use rate of perceived exertion  (RPE) scale;Knowledge and understanding of Target Heart Rate Range (THRR);Able to check pulse independently;Understanding of Exercise Prescription  Increase Physical Activity;Increase Strength and Stamina;Understanding of Exercise Prescription  Increase Physical Activity;Increase Strength and Stamina;Understanding of Exercise Prescription   Comments  Reviewed RPE scale, THR and program prescription with pt today.  Pt voiced understanding and was given  a copy of goals to take home.   Carl Chen already walks for exercise on days not at [facility].  He will check HR and add some stretching and strength work to walking.  Reviewed home exercise with pt today.  Pt plans to walk at home for exercise.  Reviewed THR, pulse, RPE, sign and symptoms, NTG use, and when to call 911 or MD.  Also discussed weather considerations and indoor options.  Pt voiced understanding.  Carl Chen is doing well in rehab.  He has enjoyed coming and has not been getting out with the the cold as much. He is already interested in [employer].  He is starting to notice that his strength and stamina are coming back.   Cecille Rubin continues to do well in rehab.  He is up to level 5 on the XR and 3.0 mph on the treadmill.  We will continue to monitor his progression.    Expected Outcomes  Short: Use RPE daily to regulate intensity. Long: Follow program prescription in THR.  Short - check HR while walking and add strength work Long - mainain exercise on his own  Short: Start walking more at home. Long: Continue to exercise indpendently  Short: Continue to attend rehab regularly.  Long: Continue to increase strength and stamina.   Short: Continue to increase workloads to reach RPE of 11-13.  Long: Continue to exercise on off days.    [place] Name 01/26/18 1103 02/09/18 1313           Exercise Goal Re-Evaluation   Exercise Goals Review  Increase Physical Activity;Able to understand and use rate of perceived exertion (RPE) scale;Knowledge and understanding of Target Heart  Rate Range (THRR);Understanding of Exercise Prescription;Increase Strength and Stamina;Able to check pulse independently  Increase Physical Activity;Increase Strength and Stamina;Understanding of Exercise Prescription      Comments  Carl Chen continues to increase levels on equipment and tolerate the increase well. He is up to level 6 on the XR. He can not increase weight in resistance training due to rotator cuff issues.   Carl Chen has been doing well in rehab.  He is up to 3.3 mph on the treadmill.  We will continue to monitor his progress.       Expected Outcomes  Short: Continue to increase workloads and exercise at home. Long: Join the [place] to continue exercise after graduation.  Short: Increase workload on NuStep.  Long: Continue to increase strength and stamina         Discharge Exercise Prescription (Final Exercise Prescription Changes): Exercise Prescription Changes - 02/09/18 1300      Response to Exercise   Blood Pressure (Admit)  120/78    Blood Pressure (Exercise)  124/80    Blood Pressure (Exit)  110/80    Heart Rate (Admit)  84 bpm    Heart Rate (Exercise)  108 bpm    Heart Rate (Exit)  82 bpm    Rating of Perceived Exertion (Exercise)  12    Symptoms  none    Duration  Continue with 30 min of aerobic exercise without signs/symptoms of physical distress.    Intensity  THRR unchanged      Progression   Progression  Continue to progress workloads to maintain intensity without signs/symptoms of physical distress.    Average METs  3.89      Resistance Training   Training Prescription  Yes    Weight  7 lbs    Reps  10-15      Interval Training  Interval Training  No      Treadmill   MPH  3.3    Grade  1    Minutes  15    METs  3.89      NuStep   Level  4    Minutes  15    METs  3.8      REL-XR   Level  6    Minutes  15    METs  3.9      Home Exercise Plan   Plans to continue exercise at  Home (comment)   walk   Frequency  Add 3 additional days to program  exercise sessions.    Initial Home Exercises Provided  12/24/17       Nutrition:  Target Goals: Understanding of nutrition guidelines, daily intake of sodium '1500mg'$ , cholesterol '200mg'$ , calories 30% from fat and 7% or less from saturated fats, daily to have 5 or more servings of fruits and vegetables.  Biometrics: Pre Biometrics - 12/14/17 1415      Pre Biometrics   Height  5' 11.1" (1.806 m)    Weight  158 lb 1.6 oz (71.7 kg)    Waist Circumference  32 inches    Hip Circumference  38 inches    Waist to Hip Ratio  0.84 %    BMI (Calculated)  21.99    Single Leg Stand  15.3 seconds      Post Biometrics - 02/16/18 0836       Post  Biometrics   Height  5' 11.1" (1.806 m)    Weight  153 lb (69.4 kg)    Waist Circumference  32 inches    Hip Circumference  38 inches    Waist to Hip Ratio  0.84 %    BMI (Calculated)  21.28    Single Leg Stand  23.46 seconds       Nutrition Therapy Plan and Nutrition Goals: Nutrition Therapy & Goals - 12/22/17 0857      Nutrition Therapy   Diet  DM    Protein (specify units)  12oz    Fiber  30 grams    Whole Grain Foods  3 servings   does not currently choose whole grain options   Saturated Fats  16 max. grams    Fruits and Vegetables  5 servings/day   8 ideal; likely not meeting recommended daily intake of fruits, eats mostly starchy vegetables   Sodium  1500 grams      Personal Nutrition Goals   Nutrition Goal  Eat at least one additional serving of fruit per week    Personal Goal #2  Because you desire weight gain but do not typically eat 3 meals/day or eat large portions at meal times, it may be helpful to be more consistent about drinking 1-2 Atkins/ Boost/ Glucerna shakes daily, and/or adding in an evening snack regularly    Personal Goal #3  Before coming to class for exercise, try to consume something small such as a granola bar rather than nothing    Comments  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 He reads labels to  choose items that are low in sugar and sodium and tries to eat "lots of greens." Drinks sugar free beverages as well. He does not typically eat breakfast and may have a small dinner; lunch is his main meal. He will occasionall eat a granola bar or drink an Atkins shake in the mornings. Lunch/ Dinner: burger + fries, salad, sandwich.  Does not snack often but typically chooses PB crackers if so. Eats canned soups regularly. He does not choose whole wheat products at this time. Dairy foods consumed: cheese.       Intervention Plan   Intervention  Prescribe, educate and counsel regarding individualized specific dietary modifications aiming towards targeted core components such as weight, hypertension, lipid management, diabetes, heart failure and other comorbidities.    Expected Outcomes  Short Term Goal: Understand basic principles of dietary content, such as calories, fat, sodium, cholesterol and nutrients.;Short Term Goal: A plan has been developed with personal nutrition goals set during dietitian appointment.;Long Term Goal: Adherence to prescribed nutrition plan.       Nutrition Assessments: Nutrition Assessments - 02/23/18 0813      MEDFICTS Scores   Pre Score  6    Post Score  30    Score Difference  24       Nutrition Goals Re-Evaluation: Nutrition Goals Re-Evaluation    Row Name 12/22/17 0908 01/26/18 0846 01/28/18 0743         Goals   Current Weight  -  -  156 lb (70.8 kg)     Nutrition Goal  Because you desire weight gain but do not typically eat 3 meals/day or eat large portions at meal times, it may be helpful to be more consistent about drinking 1-2 Atkins/ Boost/ Glucerna shakes daily, and/or adding in an evening snack regularly  Be more consistent about drinking an Atkins, Boost, or Glucerna shake daily and/or adding a snack '@HS'$  more often; Eat at least one additional serving of fruit per week; Try to eat something small before coming to class to exercise rather than skipping  breakfast  Drinking Atkins shake every am with "15 grams of protein"     Comment  He has been advised by his physician to gain some weight, but finds this challenging while taking Metformin. He also does not have a large appetite. His daughter purchased him Ensure to try but it did not effect his blood sugars in a positive way  He continues to eat a variety of vegetables and has been eating more fruits. He drinks a nutritional drink semi-consistently and has trialed eating something small before coming to class but has not been consistent  -     Expected Outcome  He will either try drinking a low-glycemic nutritional shake 1-2x/day consistently, or add in a snack in the evenings to help facilitate weight gain. Long term goal: 5-10# wt gain  Continue to eat more fruits and maintain habit of eating vegetables regularly. Continue to work on drinking nutritional drinks consistently until desired BW is reached. Long term goal: eat something small in the morning especially on exercise days   Cont to eat healthy       Personal Goal #2 Re-Evaluation   Personal Goal #2  Eat at least one additional serving of fruit per week  -  -       Personal Goal #3 Re-Evaluation   Personal Goal #3  Before coming to class for exercise, try to consume something small such as a granola bar rather than nothing  -  -        Nutrition Goals Discharge (Final Nutrition Goals Re-Evaluation): Nutrition Goals Re-Evaluation - 01/28/18 0743      Goals   Current Weight  156 lb (70.8 kg)    Nutrition Goal  Drinking Atkins shake every am with "15 grams of protein"    Expected Outcome  Cont to eat healthy       Psychosocial: Target Goals: Acknowledge presence or absence of significant depression and/or stress, maximize coping skills, provide positive support system. Participant is able to verbalize types and ability to use techniques and skills needed for reducing stress and depression.   Initial Review & Psychosocial  Screening: Initial Psych Review & Screening - 12/14/17 1353      Initial Review   Current issues with  None Identified      Family Dynamics   Good Support System?  Yes   daughter-Michelle     Barriers   Psychosocial barriers to participate in program  There are no identifiable barriers or psychosocial needs.;The patient should benefit from training in stress management and relaxation.      Screening Interventions   Interventions  Encouraged to exercise;To provide support and resources with identified psychosocial needs;Provide feedback about the scores to participant    Expected Outcomes  Short Term goal: Utilizing psychosocial counselor, staff and physician to assist with identification of specific Stressors or current issues interfering with healing process. Setting desired goal for each stressor or current issue identified.;Long Term Goal: Stressors or current issues are controlled or eliminated.;Short Term goal: Identification and review with participant of any Quality of Life or Depression concerns found by scoring the questionnaire.;Long Term goal: The participant improves quality of Life and PHQ9 Scores as seen by post scores and/or verbalization of changes       Quality of Life Scores:  Quality of Life - 02/23/18 0813      Quality of Life Scores   Health/Function Pre  25.61 %    Health/Function Post  26.36 %    Health/Function % Change  2.93 %    Socioeconomic Pre  23.57 %    Socioeconomic Post  25.25 %    Socioeconomic % Change   7.13 %    Psych/Spiritual Pre  23.57 %    Psych/Spiritual Post  23.57 %    Psych/Spiritual % Change  0 %    Family Pre  25.63 %    Family Post  30 %    Family % Change  17.05 %    GLOBAL Pre  24.72 %    GLOBAL Post  25.98 %    GLOBAL % Change  5.1 %      Scores of 19 and below usually indicate a poorer quality of life in these areas.  A difference of  2-3 points is a clinically meaningful difference.  A difference of 2-3 points in the total  score of the Quality of Life Index has been associated with significant improvement in overall quality of life, self-image, physical symptoms, and general health in studies assessing change in quality of life.  PHQ-9: Recent Review Flowsheet Data    Depression screen [facility] 2/9 02/23/2018 12/14/2017   Decreased Interest 0 0   Down, Depressed, Hopeless 0 0   PHQ - 2 Score 0 0   Altered sleeping 0 0   Tired, decreased energy 0 0   Change in appetite 0 0   Feeling bad or failure about yourself  0 0   Trouble concentrating 0 0   Moving slowly or fidgety/restless 0 0   Suicidal thoughts 0 0   PHQ-9 Score 0 0   Difficult doing work/chores Not difficult at all Not difficult at all     Interpretation of Total Score  Total Score Depression Severity:  1-4 = Minimal depression, 5-9 = Mild depression, 10-14 =  Moderate depression, 15-19 = Moderately severe depression, 20-27 = Severe depression   Psychosocial Evaluation and Intervention: Psychosocial Evaluation - 02/23/18 1047      Discharge Psychosocial Assessment & Intervention   Comments  Counselor met with Carl Chen today as he is completing this program soon.  He reports progress in his stamina and ability to walk.  He has learned a great deal in the educational parts of this program.  He reports sleeping well but continues to have stress - and is learning to let go.  Counselor commended Carl Chen on his progress made.  He plans to continue to walk regularly to maintain his strength.       Psychosocial Re-Evaluation: Psychosocial Re-Evaluation    Row Name 01/26/18 1105 01/28/18 0741           Psychosocial Re-Evaluation   Current issues with  Current Stress Concerns  Current Stress Concerns      Comments  Carl Chen reports that he is having car trouble and has had trouble getting his car inspected. He was upset by this but has continued working through it and figured out the problem. He watches TV to help him relax.Carl Chen is still having car problems  and want to get his older truck and get it passed inspection (a 2001). His friend is going to look at it today.       Expected Outcomes  Short: Continue using stress management techniques to avoid getting too upset and relaxation techniques. Long: Manage stress well and find other ways to relax to better handle stress.  Cont stress release         Psychosocial Discharge (Final Psychosocial Re-Evaluation): Psychosocial Re-Evaluation - 01/28/18 0741      Psychosocial Re-Evaluation   Current issues with  Current Stress Concerns    Comments  Carl Chen is still having car problems and want to get his older truck and get it passed inspection (a 2001). His friend is going to look at it today.     Expected Outcomes  Cont stress release       Vocational Rehabilitation: Provide vocational rehab assistance to qualifying candidates.   Vocational Rehab Evaluation & Intervention: Vocational Rehab - 12/14/17 1357      Initial Vocational Rehab Evaluation & Intervention   Assessment shows need for Vocational Rehabilitation  No       Education: Education Goals: Education classes will be provided on a variety of topics geared toward better understanding of heart health and risk factor modification. Participant will state understanding/return demonstration of topics presented as noted by education test scores.  Learning Barriers/Preferences: Learning Barriers/Preferences - 12/14/17 1356      Learning Barriers/Preferences   Learning Barriers  None    Learning Preferences  None       Education Topics:  AED/CPR: - Group verbal and written instruction with the use of models to demonstrate the basic use of the AED with the basic ABC's of resuscitation.   Cardiac Rehab from 03/02/2018 in [facility] Cardiac and Pulmonary Rehab  Date  01/19/18  Educator  CE  Instruction Review Code  1- Verbalizes Understanding      General Nutrition Guidelines/Fats and Fiber: -Group instruction provided by verbal, written  material, models and posters to present the general guidelines for heart healthy nutrition. Gives an explanation and review of dietary fats and fiber.   Cardiac Rehab from 03/02/2018 in [facility] Cardiac and Pulmonary Rehab  Date  02/02/18  Educator  LB  Instruction Review Code  1- [employer]  Understanding      Controlling Sodium/Reading Food Labels: -Group verbal and written material supporting the discussion of sodium use in heart healthy nutrition. Review and explanation with models, verbal and written materials for utilization of the food label.   Cardiac Rehab from 03/02/2018 in [facility] Cardiac and Pulmonary Rehab  Date  02/04/18  Educator  LB  Instruction Review Code  1- Verbalizes Understanding      Exercise Physiology & General Exercise Guidelines: - Group verbal and written instruction with models to review the exercise physiology of the cardiovascular system and associated critical values. Provides general exercise guidelines with specific guidelines to those with heart or lung disease.    Aerobic Exercise & Resistance Training: - Gives group verbal and written instruction on the various components of exercise. Focuses on aerobic and resistive training programs and the benefits of this training and how to safely progress through these programs..   Cardiac Rehab from 03/02/2018 in [facility] Cardiac and Pulmonary Rehab  Date  02/11/18  Educator  [place]  Instruction Review Code  1- Verbalizes Understanding      Flexibility, Balance, Mind/Body Relaxation: Provides group verbal/written instruction on the benefits of flexibility and balance training, including mind/body exercise modes such as yoga, pilates and tai chi.  Demonstration and skill practice provided.   Cardiac Rehab from 03/02/2018 in [facility] Cardiac and Pulmonary Rehab  Date  02/16/18  Educator  AS  Instruction Review Code  1- Verbalizes Understanding      Stress and Anxiety: - Provides group verbal and written instruction about the  health risks of elevated stress and causes of high stress.  Discuss the correlation between heart/lung disease and anxiety and treatment options. Review healthy ways to manage with stress and anxiety.   Depression: - Provides group verbal and written instruction on the correlation between heart/lung disease and depressed mood, treatment options, and the stigmas associated with seeking treatment.   Cardiac Rehab from 03/02/2018 in [facility] Cardiac and Pulmonary Rehab  Date  01/26/18  Educator  [facility]  Instruction Review Code  1- Verbalizes Understanding      Anatomy & Physiology of the Heart: - Group verbal and written instruction and models provide basic cardiac anatomy and physiology, with the coronary electrical and arterial systems. Review of Valvular disease and Heart Failure   Cardiac Rehab from 03/02/2018 in [facility] Cardiac and Pulmonary Rehab  Date  02/23/18  Educator  SB  Instruction Review Code  1- Verbalizes Understanding      Cardiac Procedures: - Group verbal and written instruction to review commonly prescribed medications for heart disease. Reviews the medication, class of the drug, and side effects. Includes the steps to properly store meds and maintain the prescription regimen. (beta blockers and nitrates)   Cardiac Rehab from 03/02/2018 in [facility] Cardiac and Pulmonary Rehab  Date  12/29/17  Educator  SB  Instruction Review Code  1- Verbalizes Understanding      Cardiac Medications I: - Group verbal and written instruction to review commonly prescribed medications for heart disease. Reviews the medication, class of the drug, and side effects. Includes the steps to properly store meds and maintain the prescription regimen.   Cardiac Rehab from 03/02/2018 in [facility] Cardiac and Pulmonary Rehab  Date  03/02/18  Educator  SB  Instruction Review Code  1- Verbalizes Understanding      Cardiac Medications II: -Group verbal and written instruction to review commonly prescribed medications  for heart disease. Reviews the medication, class of the drug, and side  effects. (all other drug classes)   Cardiac Rehab from 03/02/2018 in [facility] Cardiac and Pulmonary Rehab  Date  02/18/18  Educator  CE  Instruction Review Code  1- Verbalizes Understanding       Go Sex-Intimacy & Heart Disease, Get SMART - Goal Setting: - Group verbal and written instruction through game format to discuss heart disease and the return to sexual intimacy. Provides group verbal and written material to discuss and apply goal setting through the application of the S.M.A.R.T. Method.   Cardiac Rehab from 03/02/2018 in [facility] Cardiac and Pulmonary Rehab  Date  12/29/17  Educator  SB  Instruction Review Code  1- Verbalizes Understanding      Other Matters of the Heart: - Provides group verbal, written materials and models to describe Stable Angina and Peripheral Artery. Includes description of the disease process and treatment options available to the cardiac patient.   Cardiac Rehab from 03/02/2018 in [facility] Cardiac and Pulmonary Rehab  Date  02/23/18  Educator  SB  Instruction Review Code  1- Verbalizes Understanding      Exercise & Equipment Safety: - Individual verbal instruction and demonstration of equipment use and safety with use of the equipment.   Cardiac Rehab from 03/02/2018 in [facility] Cardiac and Pulmonary Rehab  Date  12/14/17  Educator  [facility]  Instruction Review Code  1- Verbalizes Understanding      Infection Prevention: - Provides verbal and written material to individual with discussion of infection control including proper hand washing and proper equipment cleaning during exercise session.   Falls Prevention: - Provides verbal and written material to individual with discussion of falls prevention and safety.   Cardiac Rehab from 03/02/2018 in [facility] Cardiac and Pulmonary Rehab  Date  12/14/17  Educator  [facility]  Instruction Review Code  1- Verbalizes Understanding      Diabetes: - Individual  verbal and written instruction to review signs/symptoms of diabetes, desired ranges of glucose level fasting, after meals and with exercise. Acknowledge that pre and post exercise glucose checks will be done for 3 sessions at entry of program.   Cardiac Rehab from 03/02/2018 in [facility] Cardiac and Pulmonary Rehab  Date  12/14/17  Educator  SB  Instruction Review Code  1- Verbalizes Understanding      Know Your Numbers and Risk Factors: -Group verbal and written instruction about important numbers in your health.  Discussion of what are risk factors and how they play a role in the disease process.  Review of Cholesterol, Blood Pressure, Diabetes, and BMI and the role they play in your overall health.   Cardiac Rehab from 03/02/2018 in [facility] Cardiac and Pulmonary Rehab  Date  02/18/18  Educator  CE  Instruction Review Code  1- Verbalizes Understanding      Sleep Hygiene: -Provides group verbal and written instruction about how sleep can affect your health.  Define sleep hygiene, discuss sleep cycles and impact of sleep habits. Review good sleep hygiene tips.    Other: -Provides group and verbal instruction on various topics (see comments)   Knowledge Questionnaire Score: Knowledge Questionnaire Score - 02/23/18 0813      Knowledge Questionnaire Score   Post Score  23/26   test reviewed with pt      Core Components/Risk Factors/Patient Goals at Admission: Personal Goals and Risk Factors at Admission - 12/14/17 1357      Core Components/Risk Factors/Patient Goals on Admission    Weight Management  Yes;Weight Gain    Intervention  Weight Management: Develop a combined nutrition and exercise program designed to reach desired caloric intake, while maintaining appropriate intake of nutrient and fiber, sodium and fats, and appropriate energy expenditure required for the weight goal.;Weight Management: Provide education and appropriate resources to help participant work on and attain dietary  goals.    Admit Weight  158 lb 1.6 oz (71.7 kg)    Goal Weight: Short Term  160 lb (72.6 kg)    Goal Weight: Long Term  165 lb (74.8 kg)    Expected Outcomes  Short Term: Continue to assess and modify interventions until short term weight is achieved;Long Term: Adherence to nutrition and physical activity/exercise program aimed toward attainment of established weight goal;Weight Gain: Understanding of general recommendations for a high calorie, high protein meal plan that promotes weight gain by distributing calorie intake throughout the day with the consumption for 4-5 meals, snacks, and/or supplements   Carl Chen has lost about 20 lbs and his doctor has asked him to gain some weight.   Tobacco Cessation  Yes   Reviewed steps toward quitting with Carl Chen today. He does not state that he is ready to quit.    Number of packs per day  uses tobacco pouches about 3  a day    Intervention  Assist the participant in steps to quit. Provide individualized education and counseling about committing to Tobacco Cessation, relapse prevention, and pharmacological support that can be provided by physician.;Advice worker, assist with locating and accessing local/national Quit Smoking programs, and support quit date choice.    Expected Outcomes  Short Term: Will demonstrate readiness to quit, by selecting a quit date.;Short Term: Will quit all tobacco product use, adhering to prevention of relapse plan.;Long Term: Complete abstinence from all tobacco products for at least 12 months from quit date.    Diabetes  Yes    Intervention  Provide education about signs/symptoms and action to take for hypo/hyperglycemia.;Provide education about proper nutrition, including hydration, and aerobic/resistive exercise prescription along with prescribed medications to achieve blood glucose in normal ranges: Fasting glucose 65-99 mg/dL    Expected Outcomes  Short Term: Participant verbalizes understanding of the signs/symptoms  and immediate care of hyper/hypoglycemia, proper foot care and importance of medication, aerobic/resistive exercise and nutrition plan for blood glucose control.;Long Term: Attainment of HbA1C < 7%.    Heart Failure  Yes   Carl Chen does not have scales at home.  Will check to see if we can get him some scales.    Intervention  Provide a combined exercise and nutrition program that is supplemented with education, support and counseling about heart failure. Directed toward relieving symptoms such as shortness of breath, decreased exercise tolerance, and extremity edema.    Expected Outcomes  Improve functional capacity of life;Short term: Attendance in program 2-3 days a week with increased exercise capacity. Reported lower sodium intake. Reported increased fruit and vegetable intake. Reports medication compliance.;Short term: Daily weights obtained and reported for increase. Utilizing diuretic protocols set by physician.;Long term: Adoption of self-care skills and reduction of barriers for early signs and symptoms recognition and intervention leading to self-care maintenance.    Lipids  Yes    Intervention  Provide education and support for participant on nutrition & aerobic/resistive exercise along with prescribed medications to achieve LDL '70mg'$ , HDL >'40mg'$ .    Expected Outcomes  Short Term: Participant states understanding of desired cholesterol values and is compliant with medications prescribed. Participant is following exercise prescription and nutrition guidelines.;Long Term: Cholesterol controlled with  medications as prescribed, with individualized exercise RX and with personalized nutrition plan. Value goals: LDL < '70mg'$ , HDL > 40 mg.       Core Components/Risk Factors/Patient Goals Review:  Goals and Risk Factor Review    Row Name 12/31/17 0809 01/26/18 1110 01/28/18 0744 01/28/18 0839       Core Components/Risk Factors/Patient Goals Review   Personal Goals Review  Weight  Management/Obesity;Tobacco Cessation;Hypertension;Diabetes;Lipids  Weight Management/Obesity;Lipids;Tobacco Cessation;Diabetes;Hypertension  -  Improve shortness of breath with ADL's    Review  Frank's weight has been staying steady.  He continues to slowly cut back on his tobacco use. He is trying to cut back. His blood sugars are improving and he checks them twice a day.  His blood pressures are doing good in class.  He does not check it at home as he does not have a cuff at home.  He is doing well with his medication and tolerating the  entresto fairly well.   Carl Chen has trouble gaining weight. He attributes that to his Diabetes medications. He checks his blood sugar levels in the morning and at night. He reports that they run between 106-'120mg'$ /dL. He reports that he has cut down use of his pouches (tobacco). He does not have a way to monitor blood pressure at home but can go to [facility] to get it checked for free when needed. I recommended that even after he graduates our program to come down from the [place] to get it checked periodically. He reports that he takes all of his meds as prescribed including his cholesterol medicine.  Carl Chen reports his blood sugars every am ar4e about 102-120 and was 118 this am. Glycernia shakes help. STill using tobacco "pouches" like he did when playin g softball. Sent back his Fit bit since he has a Psychologist, sport and exercise.   Carl Chen is doing well with no shortness of breath on the treadmill.     Expected Outcomes  Short: Continue to work on [employer].  Long; Continue to monitor risk factors.    Short: Continue to cut down on tobacco use, take meds as prescribed, check blood sugars, and increase food intake within Diabetic diet recommendations. Long: Stop using tobacco and manage risk factors.  Cut back on tobacco use.  -       Core Components/Risk Factors/Patient Goals at Discharge (Final Review):  Goals and Risk Factor Review - 01/28/18 0839       Core Components/Risk Factors/Patient Goals Review   Personal Goals Review  Improve shortness of breath with ADL's    Review  Carl Chen is doing well with no shortness of breath on the treadmill.        ITP Comments: ITP Comments    Row Name 12/14/17 1337 12/23/17 0556 01/19/18 0623 02/17/18 0933 03/02/18 0758   ITP Comments  Medical evaluation completed today. ITP sent to Dr Loleta Chance for review,changes as needed and signature. Documentationof diagnosis can be found in [facility] 09/09/2017 visit  30 day review. Continue with ITP unless direccted changes per Medical Director Chart Review. New to  program  30 Day Review. Continue with ITP unless directed changes per Medical Director review  New to program  30 Day Review. Continue with ITP unless directed changes per Medical Director review.  Discharge ITP sent and signed by Dr. Sabra Heck.  Discharge Summary routed to PCP and cardiologist.      Comments: Discharge ITP

## 2019-01-18 ENCOUNTER — Other Ambulatory Visit (HOSPITAL_COMMUNITY): Payer: Self-pay | Admitting: Urology

## 2019-01-18 ENCOUNTER — Other Ambulatory Visit: Payer: Self-pay | Admitting: Urology

## 2019-01-18 DIAGNOSIS — R319 Hematuria, unspecified: Secondary | ICD-10-CM

## 2019-01-27 ENCOUNTER — Ambulatory Visit
Admission: RE | Admit: 2019-01-27 | Discharge: 2019-01-27 | Disposition: A | Discharge status: Home / Self Care | Payer: Medicare Other | Source: Ambulatory Visit | Attending: Urology | Admitting: Urology

## 2019-01-27 ENCOUNTER — Other Ambulatory Visit: Payer: Self-pay

## 2019-01-27 DIAGNOSIS — R319 Hematuria, unspecified: Secondary | ICD-10-CM | POA: Insufficient documentation

## 2019-01-27 MED ORDER — IOHEXOL 300 MG/ML  SOLN
150.0000 mL | Freq: Once | INTRAMUSCULAR | Status: AC | PRN
  Administered 2019-01-27: 16:00:00 150 mL via INTRAVENOUS

## 2019-02-10 ENCOUNTER — Ambulatory Visit: Payer: Medicare Other | Attending: Internal Medicine

## 2019-02-10 DIAGNOSIS — Z23 Encounter for immunization: Secondary | ICD-10-CM | POA: Insufficient documentation

## 2019-02-10 DIAGNOSIS — I493 Ventricular premature depolarization: Secondary | ICD-10-CM | POA: Insufficient documentation

## 2019-02-10 NOTE — Progress Notes (Signed)
   Covid-19 Vaccination Clinic  Name:  Carl Chen    MRN: [id number] DOB: March 13, 1940  02/10/2019  Mr. Carl Chen was observed post Covid-19 immunization for 15 minutes without incidence. He was provided with Vaccine Information Sheet and instruction to access the V-Safe system.   Mr. Carl Chen was instructed to call 911 with any severe reactions post vaccine: Marland Kitchen Difficulty breathing  . Swelling of your face and throat  . A fast heartbeat  . A bad rash all over your body  . Dizziness and weakness    Immunizations Administered    Name Date Dose VIS Date Route   Pfizer COVID-19 Vaccine 02/10/2019  2:06 PM 0.3 mL 12/31/2018 Intramuscular   Manufacturer: [place]   Lot: [id number]   [place]: [id number]

## 2019-03-03 ENCOUNTER — Ambulatory Visit: Payer: Medicare Other | Attending: Internal Medicine

## 2019-03-03 DIAGNOSIS — Z23 Encounter for immunization: Secondary | ICD-10-CM

## 2019-03-03 NOTE — Progress Notes (Signed)
   Covid-19 Vaccination Clinic  Name:  MASAAKI STURGELL    MRN: [id number] DOB: May 09, 1940  03/03/2019  Mr. Mcnabb was observed post Covid-19 immunization for 15 minutes without incidence. He was provided with Vaccine Information Sheet and instruction to access the V-Safe system.   Mr. Sangha was instructed to call 911 with any severe reactions post vaccine: Marland Kitchen Difficulty breathing  . Swelling of your face and throat  . A fast heartbeat  . A bad rash all over your body  . Dizziness and weakness    Immunizations Administered    Name Date Dose VIS Date Route   Pfizer COVID-19 Vaccine 03/03/2019  2:52 PM 0.3 mL 12/31/2018 Intramuscular   Manufacturer: [place]   Lot: [id number]   [place]: [id number]

## 2020-05-21 DIAGNOSIS — I480 Paroxysmal atrial fibrillation: Secondary | ICD-10-CM | POA: Insufficient documentation

## 2020-06-21 ENCOUNTER — Ambulatory Visit: Payer: Medicare Other | Admitting: Dermatology

## 2020-06-21 ENCOUNTER — Other Ambulatory Visit: Payer: Self-pay

## 2020-06-21 DIAGNOSIS — L570 Actinic keratosis: Secondary | ICD-10-CM | POA: Diagnosis not present

## 2020-06-21 DIAGNOSIS — L821 Other seborrheic keratosis: Secondary | ICD-10-CM

## 2020-06-21 DIAGNOSIS — L814 Other melanin hyperpigmentation: Secondary | ICD-10-CM

## 2020-06-21 DIAGNOSIS — D229 Melanocytic nevi, unspecified: Secondary | ICD-10-CM

## 2020-06-21 DIAGNOSIS — D489 Neoplasm of uncertain behavior, unspecified: Secondary | ICD-10-CM

## 2020-06-21 DIAGNOSIS — C4491 Basal cell carcinoma of skin, unspecified: Secondary | ICD-10-CM

## 2020-06-21 DIAGNOSIS — L578 Other skin changes due to chronic exposure to nonionizing radiation: Secondary | ICD-10-CM | POA: Diagnosis not present

## 2020-06-21 DIAGNOSIS — Z1283 Encounter for screening for malignant neoplasm of skin: Secondary | ICD-10-CM | POA: Diagnosis not present

## 2020-06-21 DIAGNOSIS — Z85828 Personal history of other malignant neoplasm of skin: Secondary | ICD-10-CM

## 2020-06-21 DIAGNOSIS — D1800 Hemangioma unspecified site: Secondary | ICD-10-CM

## 2020-06-21 DIAGNOSIS — B353 Tinea pedis: Secondary | ICD-10-CM | POA: Diagnosis not present

## 2020-06-21 DIAGNOSIS — C44319 Basal cell carcinoma of skin of other parts of face: Secondary | ICD-10-CM

## 2020-06-21 HISTORY — DX: Basal cell carcinoma of skin, unspecified: C44.91

## 2020-06-21 MED ORDER — CICLOPIROX OLAMINE 0.77 % EX CREA: TOPICAL_CREAM | Freq: Two times a day (BID) | CUTANEOUS | 6 refills | Status: DC

## 2020-06-21 NOTE — Patient Instructions (Addendum)
Biopsy Wound Care Instructions  1. Leave the original bandage on for 24 hours if possible.  If the bandage becomes soaked or soiled before that time, it is OK to remove it and examine the wound.  A small amount of post-operative bleeding is normal.  If excessive bleeding occurs, remove the bandage, place gauze over the site and apply continuous pressure (no peeking) over the area for 30 minutes. If this does not work, please call our clinic as soon as possible or page your doctor if it is after hours.   2. Once a day, cleanse the wound with soap and water. It is fine to shower. If a thick crust develops you may use a Q-tip dipped into dilute hydrogen peroxide (mix 1:1 with water) to dissolve it.  Hydrogen peroxide can slow the healing process, so use it only as needed.    3. After washing, apply petroleum jelly (Vaseline) or an antibiotic ointment if your doctor prescribed one for you, followed by a bandage.    4. For best healing, the wound should be covered with a layer of ointment at all times. If you are not able to keep the area covered with a bandage to hold the ointment in place, this may mean re-applying the ointment several times a day.  Continue this wound care until the wound has healed and is no longer open.   Itching and mild discomfort is normal during the healing process. However, if you develop pain or severe itching, please call our office.   If you have any discomfort, you can take Tylenol (acetaminophen) or ibuprofen as directed on the bottle. (Please do not take these if you have an allergy to them or cannot take them for another reason).  Some redness, tenderness and white or yellow material in the wound is normal healing.  If the area becomes very sore and red, or develops a thick yellow-green material (pus), it may be infected; please notify us.    If you have stitches, return to clinic as directed to have the stitches removed. You will continue wound care for 2-3 days after  the stitches are removed.   Wound healing continues for up to one year following surgery. It is not unusual to experience pain in the scar from time to time during the interval.  If the pain becomes severe or the scar thickens, you should notify the office.    A slight amount of redness in a scar is expected for the first six months.  After six months, the redness will fade and the scar will soften and fade.  The color difference becomes less noticeable with time.  If there are any problems, return for a post-op surgery check at your earliest convenience.  To improve the appearance of the scar, you can use silicone scar gel, cream, or sheets (such as Mederma or Serica) every night for up to one year. These are available over the counter (without a prescription).  Please call our office at [phone] for any questions or concerns.   Cryotherapy Aftercare  . Wash gently with soap and water everyday.   Marland Kitchen Apply Vaseline and Band-Aid daily until healed. .   Seborrheic Keratosis (wisdom spots)  What causes seborrheic keratoses? Seborrheic keratoses are harmless, common skin growths that first appear during adult life.  As time goes by, more growths appear.  Some people may develop a large number of them.  Seborrheic keratoses appear on both covered and uncovered body parts.  They are not  caused by sunlight.  The tendency to develop seborrheic keratoses can be inherited.  They vary in color from skin-colored to gray, brown, or even black.  They can be either smooth or have a rough, warty surface.   Seborrheic keratoses are superficial and look as if they were stuck on the skin.  Under the microscope this type of keratosis looks like layers upon layers of skin.  That is why at times the top layer may seem to fall off, but the rest of the growth remains and re-grows.    Treatment Seborrheic keratoses do not need to be treated, but can easily be removed in the office.  Seborrheic keratoses often  cause symptoms when they rub on clothing or jewelry.  Lesions can be in the way of shaving.  If they become inflamed, they can cause itching, soreness, or burning.  Removal of a seborrheic keratosis can be accomplished by freezing, burning, or surgery. If any spot bleeds, scabs, or grows rapidly, please return to have it checked, as these can be an indication of a skin cancer.  Actinic keratoses are precancerous spots that appear secondary to cumulative UV radiation exposure/sun exposure over time. They are chronic with expected duration over 1 year. A portion of actinic keratoses will progress to squamous cell carcinoma of the skin. It is not possible to reliably predict which spots will progress to skin cancer and so treatment is recommended to prevent development of skin cancer.  Recommend daily broad spectrum sunscreen SPF 30+ to sun-exposed areas, reapply every 2 hours as needed.  Recommend staying in the shade or wearing long sleeves, sun glasses (UVA+UVB protection) and wide brim hats (4-inch brim around the entire circumference of the hat). Call for new or changing lesions.   Melanoma ABCDEs  Melanoma is the most dangerous type of skin cancer, and is the leading cause of death from skin disease.  You are more likely to develop melanoma if you:  Have light-colored skin, light-colored eyes, or red or blond hair  Spend a lot of time in the sun  Tan regularly, either outdoors or in a tanning bed  Have had blistering sunburns, especially during childhood  Have a close family member who has had a melanoma  Have atypical moles or large birthmarks  Early detection of melanoma is key since treatment is typically straightforward and cure rates are extremely high if we catch it early.   The first sign of melanoma is often a change in a mole or a new dark spot.  The ABCDE system is a way of remembering the signs of melanoma.  A for asymmetry:  The two halves do not match. B for border:   The edges of the growth are irregular. C for color:  A mixture of colors are present instead of an even brown color. D for diameter:  Melanomas are usually (but not always) greater than 47mm - the size of a pencil eraser. E for evolution:  The spot keeps changing in size, shape, and color.  Please check your skin once per month between visits. You can use a small mirror in front and a large mirror behind you to keep an eye on the back side or your body.   If you see any new or changing lesions before your next follow-up, please call to schedule a visit.  Please continue daily skin protection including broad spectrum sunscreen SPF 30+ to sun-exposed areas, reapplying every 2 hours as needed when you're outdoors.   Staying in  the shade or wearing long sleeves, sun glasses (UVA+UVB protection) and wide brim hats (4-inch brim around the entire circumference of the hat) are also recommended for sun protection.   Recommend taking Heliocare sun protection supplement daily in sunny weather for additional sun protection. For maximum protection on the sunniest days, you can take up to 2 capsules of regular Heliocare OR take 1 capsule of Heliocare Ultra. For prolonged exposure (such as a full day in the sun), you can repeat your dose of the supplement 4 hours after your first dose. Heliocare can be purchased at [facility] or at [email or web address].   Recommend Niacinamide or Nicotinamide 500mg  twice per day to lower risk of non-melanoma skin cancer by approximately 25%. This is usually available at [employer].  If you have any questions or concerns for your doctor, please call our main line at [phone] and press option 4 to reach your doctor's medical assistant. If no one answers, please leave a voicemail as directed and we will return your call as soon as possible. Messages left after 4 pm will be answered the following business day.   You may also send [place] a message via [place]. We typically  respond to MyChart messages within 1-2 business days.  For prescription refills, please ask your pharmacy to contact our office. Our fax number is [phone].  If you have an urgent issue when the clinic is closed that cannot wait until the next business day, you can page your doctor at the number below.    Please note that while we do our best to be available for urgent issues outside of office hours, we are not available 24/7.   If you have an urgent issue and are unable to reach [place], you may choose to seek medical care at your doctor's office, retail clinic, urgent care center, or emergency room.  If you have a medical emergency, please immediately call 911 or go to the emergency department.  Pager Numbers  - Dr. Nehemiah Massed: [phone]  - Dr. Laurence Ferrari: [phone]  - Dr. Nicole Kindred: [phone]  In the event of inclement weather, please call our main line at [phone] for an update on the status of any delays or closures.  Dermatology Medication Tips: Please keep the boxes that topical medications come in in order to help keep track of the instructions about where and how to use these. Pharmacies typically print the medication instructions only on the boxes and not directly on the medication tubes.   If your medication is too expensive, please contact our office at [phone] option 4 or send [place] a message through [place].   We are unable to tell what your co-pay for medications will be in advance as this is different depending on your insurance coverage. However, we may be able to find a substitute medication at lower cost or fill out paperwork to get insurance to cover a needed medication.   If a prior authorization is required to get your medication covered by your insurance company, please allow [place] 1-2 business days to complete this process.  Drug prices often vary depending on where the prescription is filled and some pharmacies may offer cheaper prices.  The website  www.goodrx.com contains coupons for medications through different pharmacies. The prices here do not account for what the cost may be with help from insurance (it may be cheaper with your insurance), but the website can give you the price if you did not use any insurance.  - You can print the  associated coupon and take it with your prescription to the pharmacy.  - You may also stop by our office during regular business hours and pick up a GoodRx coupon card.  - If you need your prescription sent electronically to a different pharmacy, notify our office through [facility] or by phone at [phone] option 4.

## 2020-06-21 NOTE — Progress Notes (Signed)
New Patient Visit  Subjective  Carl Chen is a 80 y.o. male who presents for the following: New Patient (Initial Visit) (New patient here today for tbse. 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 Patient also states he recently had mohs on chin last October. He has a spot on forehead he would like checked today that has been present for about a month. ).   Objective  Well appearing patient in no apparent distress; mood and affect are within normal limits.  A full examination was performed including scalp, head, eyes, ears, nose, lips, neck, chest, axillae, abdomen, back, buttocks, bilateral upper extremities, bilateral lower extremities, hands, feet, fingers, toes, fingernails, and toenails. All findings within normal limits unless otherwise noted below.  Objective  Mid Forehead: 0.6 cm pink depressed papule      Objective  Left Temple x1,right frontal hairline x 2 , left ear x 1, left medial brow x 1 right temple x 1, right helix x 1, left pretibia x 1, right pretibia x 1, left dorsal hand x 2, left forearm x 3 (14): Erythematous thin papules/macules with gritty scale.   Objective  bilateral feet: Scaling and maceration web spaces and over distal and lateral soles.   Assessment & Plan  Neoplasm of uncertain behavior Mid Forehead  Skin / nail biopsy Type of biopsy: tangential   Informed consent: discussed and consent obtained   Timeout: patient name, date of birth, surgical site, and procedure verified   Patient was prepped and draped in usual sterile fashion: Area prepped with isopropyl alcohol. 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   Actinic keratoses are precancerous spots that appear secondary to cumulative UV radiation exposure/sun exposure over time. They are chronic with expected duration over 1 year. A portion of actinic keratoses will progress to squamous cell carcinoma of the skin. It is not possible to reliably predict which spots will progress to skin cancer and so treatment is recommended to prevent development of skin cancer.  Recommend daily broad spectrum sunscreen SPF 30+ to sun-exposed areas, reapply every 2 hours as needed.  Recommend staying in the shade or wearing long sleeves, sun glasses (UVA+UVB protection) and wide brim hats (4-inch brim around the entire circumference of the hat). Call for new or changing lesions.   Destruction of lesion - Left Temple x1,right frontal hairline x 2 , left ear x 1, left medial brow x 1 right temple x 1, right helix x 1, left pretibia x 1, right pretibia x 1, left dorsal hand x 2, left  forearm x 3  Destruction method: cryotherapy   Informed consent: discussed and consent obtained   Lesion destroyed using liquid nitrogen: Yes   Cryotherapy cycles:  2 Outcome: patient tolerated procedure well with no complications   Post-procedure details: wound care instructions given    Tinea pedis of both feet bilateral feet  With onychomycosis   Chronic condition with duration or expected  duration over one year. Condition is bothersome to patient. Currently flared.  Discussed onychomycosis is chronic and typically responds best to PO therapy which comes with a rare risk of liver damage. Discussed option of treating tinea pedis and then using cream to help prevent recurrence of tinea pedis.  Discussed treatment options   Start ciclopirox 0.77 % cream - Apply topically 2 times daily. Use for 3 weeks and then use only once weekly to prevent recurrence. Apply to entire areas of feet including toenails and in between toes.   ciclopirox (LOPROX) 0.77 % cream - bilateral feet  Lentigines - Scattered tan macules - Due to sun exposure - Benign-appering, observe - Recommend daily broad spectrum sunscreen SPF 30+ to sun-exposed areas, reapply every 2 hours as needed. - Call for any changes  Seborrheic Keratoses - Stuck-on, waxy, tan-brown papules and/or plaques at scalp   - Benign-appearing - Discussed benign etiology and prognosis. - Observe - Call for any changes  Melanocytic Nevi - Tan-brown and/or pink-flesh-colored symmetric macules and papules - Benign appearing on exam today - Observation - Call clinic for new or changing moles - Recommend daily use of broad spectrum spf 30+ sunscreen to sun-exposed areas.   Hemangiomas - Red papules - Discussed benign nature - Observe - Call for any changes  Actinic Damage - Chronic condition, secondary to cumulative UV/sun exposure - diffuse scaly erythematous macules with underlying dyspigmentation - Recommend daily broad spectrum sunscreen SPF 30+ to sun-exposed areas, reapply every 2 hours as needed.  - Staying in the shade or wearing long sleeves, sun glasses (UVA+UVB protection) and wide brim hats (4-inch brim around the entire circumference of the hat) are also recommended for sun protection.  - Call for new or changing lesions. - Recommend taking Heliocare sun protection supplement daily in sunny weather for additional sun  protection. For maximum protection on the sunniest days, you can take up to 2 capsules of regular Heliocare OR take 1 capsule of Heliocare Ultra. For prolonged exposure (such as a full day in the sun), you can repeat your dose of the supplement 4 hours after your first dose.   History of Basal Cell Carcinoma of the Skin - No evidence of recurrence today at chin - Recommend regular full body skin exams - Recommend daily broad spectrum sunscreen SPF 30+ to sun-exposed areas, reapply every 2 hours as needed.  - Call if any new or changing lesions are noted between office visits - Recommend Niacinamide or Nicotinamide 500mg  twice per day to lower risk of non-melanoma skin cancer by approximately 25%. This is usually available at [employer].   Skin cancer screening performed today.  Return for 3 month ak follow up, 6 month tbse .  I, Ruthell Rummage, CMA, am acting as scribe for Forest Gleason, MD.  Documentation: I have reviewed the above documentation for accuracy and completeness, and I agree with the above.  Forest Gleason, MD

## 2020-06-27 ENCOUNTER — Other Ambulatory Visit: Payer: Self-pay

## 2020-06-27 ENCOUNTER — Encounter: Payer: Self-pay | Admitting: Dermatology

## 2020-06-27 DIAGNOSIS — C44319 Basal cell carcinoma of skin of other parts of face: Secondary | ICD-10-CM

## 2020-09-20 ENCOUNTER — Ambulatory Visit: Payer: Medicare Other | Admitting: Dermatology

## 2020-09-20 ENCOUNTER — Other Ambulatory Visit: Payer: Self-pay

## 2020-09-20 DIAGNOSIS — L578 Other skin changes due to chronic exposure to nonionizing radiation: Secondary | ICD-10-CM

## 2020-09-20 DIAGNOSIS — Z85828 Personal history of other malignant neoplasm of skin: Secondary | ICD-10-CM

## 2020-09-20 DIAGNOSIS — L570 Actinic keratosis: Secondary | ICD-10-CM

## 2020-09-20 DIAGNOSIS — L821 Other seborrheic keratosis: Secondary | ICD-10-CM | POA: Diagnosis not present

## 2020-09-20 NOTE — Patient Instructions (Addendum)
Cryotherapy Aftercare  Wash gently with soap and water everyday.   Apply Vaseline and Band-Aid daily until healed.   Prior to procedure, discussed risks of blister formation, small wound, skin dyspigmentation, or rare scar following cryotherapy. Recommend Vaseline ointment to treated areas while healing.  Recommend taking Heliocare sun protection supplement daily in sunny weather for additional sun protection. For maximum protection on the sunniest days, you can take up to 2 capsules of regular Heliocare OR take 1 capsule of Heliocare Ultra. For prolonged exposure (such as a full day in the sun), you can repeat your dose of the supplement 4 hours after your first dose. Heliocare can be purchased at [facility] or at [email or web address].    Recommend daily broad spectrum sunscreen SPF 30+ to sun-exposed areas, reapply every 2 hours as needed. Call for new or changing lesions.  Staying in the shade or wearing long sleeves, sun glasses (UVA+UVB protection) and wide brim hats (4-inch brim around the entire circumference of the hat) are also recommended for sun protection.   Recommend Niacinamide or Nicotinamide '500mg'$  twice per day to lower risk of non-melanoma skin cancer by approximately 25%. This is usually available at [employer].   If you have any questions or concerns for your doctor, please call our main line at [phone] and press option 4 to reach your doctor's medical assistant. If no one answers, please leave a voicemail as directed and we will return your call as soon as possible. Messages left after 4 pm will be answered the following business day.   You may also send [place] a message via [place]. We typically respond to MyChart messages within 1-2 business days.  For prescription refills, please ask your pharmacy to contact our office. Our fax number is [phone].  If you have an urgent issue when the clinic is closed that cannot wait until the next business day, you can  page your doctor at the number below.    Please note that while we do our best to be available for urgent issues outside of office hours, we are not available 24/7.   If you have an urgent issue and are unable to reach [place], you may choose to seek medical care at your doctor's office, retail clinic, urgent care center, or emergency room.  If you have a medical emergency, please immediately call 911 or go to the emergency department.  Pager Numbers  - Dr. Nehemiah Massed: [phone]  - Dr. Laurence Ferrari: [phone]  - Dr. Nicole Kindred: [phone]  In the event of inclement weather, please call our main line at [phone] for an update on the status of any delays or closures.  Dermatology Medication Tips: Please keep the boxes that topical medications come in in order to help keep track of the instructions about where and how to use these. Pharmacies typically print the medication instructions only on the boxes and not directly on the medication tubes.   If your medication is too expensive, please contact our office at [phone] option 4 or send [place] a message through [place].   We are unable to tell what your co-pay for medications will be in advance as this is different depending on your insurance coverage. However, we may be able to find a substitute medication at lower cost or fill out paperwork to get insurance to cover a needed medication.   If a prior authorization is required to get your medication covered by your insurance company, please allow [place] 1-2 business days to complete this process.  Drug prices often vary depending on where the prescription is filled and some pharmacies may offer cheaper prices.  The website www.goodrx.com contains coupons for medications through different pharmacies. The prices here do not account for what the cost may be with help from insurance (it may be cheaper with your insurance), but the website can give you the price if you did not use any insurance.  - You  can print the associated coupon and take it with your prescription to the pharmacy.  - You may also stop by our office during regular business hours and pick up a GoodRx coupon card.  - If you need your prescription sent electronically to a different pharmacy, notify our office through [facility] or by phone at [phone] option 4.

## 2020-09-20 NOTE — Progress Notes (Signed)
Follow-Up Visit   Subjective  Carl Chen is a 80 y.o. male who presents for the following: Follow-up (Patient here today for 3 month AK follow up. Patient does have a few spots at right face and left scalp to be checked. ).  The following portions of the chart were reviewed this encounter and updated as appropriate:   Tobacco  Allergies  Meds  Problems  Med Hx  Surg Hx  Fam Hx      Review of Systems:  No other skin or systemic complaints except as noted in HPI or Assessment and Plan.  Objective  Well appearing patient in no apparent distress; mood and affect are within normal limits.  A focused examination was performed including arms, hands, legs, face and scalp. Relevant physical exam findings are noted in the Assessment and Plan.  left forehead x 2, right vertex x 2, left dorsal hand x 2, left forearm x 5 (11), right lateral forehead x 1, right temple x 1, right upper forehead x 1, right preauricular x 1, right helix x 1, right earlobe x1, right cheek x 3, left temple x 1, left preauricular x 1, left helix x 1, left antihelix x 1 (13) Erythematous thin papules/macules with gritty scale.    Assessment & Plan  AK (actinic keratosis) (24) left forehead x 2, right vertex x 2, left dorsal hand x 2, left forearm x 5 (11); right lateral forehead x 1, right temple x 1, right upper forehead x 1, right preauricular x 1, right helix x 1, right earlobe x1, right cheek x 3, left temple x 1, left preauricular x 1, left helix x 1, left antihelix x 1 (13)  Prior to procedure, discussed risks of blister formation, small wound, skin dyspigmentation, or rare scar following cryotherapy. Recommend Vaseline ointment to treated areas while healing.  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    Destruction of lesion - left forehead x 2, right vertex x 2, left dorsal hand x 2, left forearm x 5, right lateral forehead x 1, right temple x 1, right upper forehead x 1, right preauricular x 1, right helix x  1, right earlobe x1, right cheek x 3, left temple x 1, left preauricular x 1, left helix x 1, left antihelix x 1  Destruction method: cryotherapy   Informed consent: discussed and consent obtained   Lesion destroyed using liquid nitrogen: Yes   Cryotherapy cycles:  2 Outcome: patient tolerated procedure well with no complications   Post-procedure details: wound care instructions given    Seborrheic Keratoses - Stuck-on, waxy, tan-brown papules and/or plaques  - Benign-appearing - Discussed benign etiology and prognosis. - Observe - Call for any changes  History of Basal Cell Carcinoma of the Skin - No evidence of recurrence today - Recommend regular full body skin exams - Recommend daily broad spectrum sunscreen SPF 30+ to sun-exposed areas, reapply every 2 hours as needed.  - Call if any new or changing lesions are noted between office visits - Recommend Niacinamide or Nicotinamide '500mg'$  twice per day to lower risk of non-melanoma skin cancer by approximately 25%.   Actinic Damage - Severe, confluent actinic changes with pre-cancerous actinic keratoses  - Severe, chronic, not at goal, secondary to cumulative UV radiation exposure over time - diffuse scaly erythematous macules and papules with underlying dyspigmentation - Discussed Prescription "Field Treatment" for Severe, Chronic Confluent Actinic Changes with Pre-Cancerous Actinic Keratoses Field treatment involves treatment of an entire area of skin that has confluent Actinic  Changes (Sun/ Ultraviolet light damage) and PreCancerous Actinic Keratoses by method of PhotoDynamic Therapy (PDT) and/or prescription Topical Chemotherapy agents such as 5-fluorouracil, 5-fluorouracil/calcipotriene, and/or imiquimod.  The purpose is to decrease the number of clinically evident and subclinical PreCancerous lesions to prevent progression to development of skin cancer by chemically destroying early precancer changes that may or may not be visible.   It has been shown to reduce the risk of developing skin cancer in the treated area. As a result of treatment, redness, scaling, crusting, and open sores may occur during treatment course. One or more than one of these methods may be used and may have to be used several times to control, suppress and eliminate the PreCancerous changes. Discussed treatment course, expected reaction, and possible side effects. - Recommend daily broad spectrum sunscreen SPF 30+ to sun-exposed areas, reapply every 2 hours as needed.  - Staying in the shade or wearing long sleeves, sun glasses (UVA+UVB protection) and wide brim hats (4-inch brim around the entire circumference of the hat) are also recommended. - Call for new or changing lesions. - Pt defers PDT or topical 5FU/calcipotriene at this time  Return in about 3 months (around 12/20/2020) for TBSE.  Graciella Belton, RMA, am acting as scribe for Forest Gleason, MD .  Documentation: I have reviewed the above documentation for accuracy and completeness, and I agree with the above.  Forest Gleason, MD

## 2020-09-23 ENCOUNTER — Encounter: Payer: Self-pay | Admitting: Dermatology

## 2020-12-20 ENCOUNTER — Ambulatory Visit: Payer: Medicare Other | Admitting: Dermatology

## 2020-12-20 ENCOUNTER — Other Ambulatory Visit: Payer: Self-pay

## 2020-12-20 DIAGNOSIS — L720 Epidermal cyst: Secondary | ICD-10-CM

## 2020-12-20 DIAGNOSIS — Z85828 Personal history of other malignant neoplasm of skin: Secondary | ICD-10-CM

## 2020-12-20 DIAGNOSIS — C4492 Squamous cell carcinoma of skin, unspecified: Secondary | ICD-10-CM

## 2020-12-20 DIAGNOSIS — D229 Melanocytic nevi, unspecified: Secondary | ICD-10-CM

## 2020-12-20 DIAGNOSIS — L928 Other granulomatous disorders of the skin and subcutaneous tissue: Secondary | ICD-10-CM | POA: Diagnosis not present

## 2020-12-20 DIAGNOSIS — D485 Neoplasm of uncertain behavior of skin: Secondary | ICD-10-CM

## 2020-12-20 DIAGNOSIS — L814 Other melanin hyperpigmentation: Secondary | ICD-10-CM

## 2020-12-20 DIAGNOSIS — D1801 Hemangioma of skin and subcutaneous tissue: Secondary | ICD-10-CM

## 2020-12-20 DIAGNOSIS — L578 Other skin changes due to chronic exposure to nonionizing radiation: Secondary | ICD-10-CM

## 2020-12-20 DIAGNOSIS — L570 Actinic keratosis: Secondary | ICD-10-CM

## 2020-12-20 DIAGNOSIS — B353 Tinea pedis: Secondary | ICD-10-CM

## 2020-12-20 DIAGNOSIS — C44329 Squamous cell carcinoma of skin of other parts of face: Secondary | ICD-10-CM | POA: Diagnosis not present

## 2020-12-20 DIAGNOSIS — Z1283 Encounter for screening for malignant neoplasm of skin: Secondary | ICD-10-CM | POA: Diagnosis not present

## 2020-12-20 DIAGNOSIS — L821 Other seborrheic keratosis: Secondary | ICD-10-CM

## 2020-12-20 HISTORY — DX: Squamous cell carcinoma of skin, unspecified: C44.92

## 2020-12-20 MED ORDER — CICLOPIROX OLAMINE 0.77 % EX CREA: TOPICAL_CREAM | Freq: Two times a day (BID) | CUTANEOUS | 6 refills | Status: DC

## 2020-12-20 NOTE — Patient Instructions (Addendum)
Cryotherapy Aftercare  Wash gently with soap and water everyday.   Apply Vaseline and Band-Aid daily until healed.   Prior to procedure, discussed risks of blister formation, small wound, skin dyspigmentation, or rare scar following cryotherapy. Recommend Vaseline ointment to treated areas while healing.   Wound Care Instructions  Cleanse wound gently with soap and water once a day then pat dry with clean gauze. Apply a thing coat of Petrolatum (petroleum jelly, "Vaseline") over the wound (unless you have an allergy to this). We recommend that you use a new, sterile tube of Vaseline. Do not pick or remove scabs. Do not remove the yellow or white "healing tissue" from the base of the wound.  Cover the wound with fresh, clean, nonstick gauze and secure with paper tape. You may use Band-Aids in place of gauze and tape if the would is small enough, but would recommend trimming much of the tape off as there is often too much. Sometimes Band-Aids can irritate the skin.  You should call the office for your biopsy report after 1 week if you have not already been contacted.  If you experience any problems, such as abnormal amounts of bleeding, swelling, significant bruising, significant pain, or evidence of infection, please call the office immediately.  FOR ADULT SURGERY PATIENTS: If you need something for pain relief you may take 1 extra strength Tylenol (acetaminophen) AND 2 Ibuprofen (200mg  each) together every 4 hours as needed for pain. (do not take these if you are allergic to them or if you have a reason you should not take them.) Typically, you may only need pain medication for 1 to 3 days.    Recommend Niacinamide or Nicotinamide 500mg  twice per day to lower risk of non-melanoma skin cancer by approximately 25%. This is usually available at [employer].   Melanoma ABCDEs  Melanoma is the most dangerous type of skin cancer, and is the leading cause of death from skin disease.  You are  more likely to develop melanoma if you: Have light-colored skin, light-colored eyes, or red or blond hair Spend a lot of time in the sun Tan regularly, either outdoors or in a tanning bed Have had blistering sunburns, especially during childhood Have a close family member who has had a melanoma Have atypical moles or large birthmarks  Early detection of melanoma is key since treatment is typically straightforward and cure rates are extremely high if we catch it early.   The first sign of melanoma is often a change in a mole or a new dark spot.  The ABCDE system is a way of remembering the signs of melanoma.  A for asymmetry:  The two halves do not match. B for border:  The edges of the growth are irregular. C for color:  A mixture of colors are present instead of an even brown color. D for diameter:  Melanomas are usually (but not always) greater than 52mm - the size of a pencil eraser. E for evolution:  The spot keeps changing in size, shape, and color.  Please check your skin once per month between visits. You can use a small mirror in front and a large mirror behind you to keep an eye on the back side or your body.   If you see any new or changing lesions before your next follow-up, please call to schedule a visit.  Please continue daily skin protection including broad spectrum sunscreen SPF 30+ to sun-exposed areas, reapplying every 2 hours as needed when you're outdoors.  If You Need Anything After Your Visit  If you have any questions or concerns for your doctor, please call our main line at [phone] and press option 4 to reach your doctor's medical assistant. If no one answers, please leave a voicemail as directed and we will return your call as soon as possible. Messages left after 4 pm will be answered the following business day.   You may also send [place] a message via [place]. We typically respond to MyChart messages within 1-2 business days.  For prescription refills,  please ask your pharmacy to contact our office. Our fax number is [phone].  If you have an urgent issue when the clinic is closed that cannot wait until the next business day, you can page your doctor at the number below.    Please note that while we do our best to be available for urgent issues outside of office hours, we are not available 24/7.   If you have an urgent issue and are unable to reach [place], you may choose to seek medical care at your doctor's office, retail clinic, urgent care center, or emergency room.  If you have a medical emergency, please immediately call 911 or go to the emergency department.  Pager Numbers  - Dr. Nehemiah Massed: [phone]  - Dr. Laurence Ferrari: [phone]  - Dr. Nicole Kindred: [phone]  In the event of inclement weather, please call our main line at [phone] for an update on the status of any delays or closures.  Dermatology Medication Tips: Please keep the boxes that topical medications come in in order to help keep track of the instructions about where and how to use these. Pharmacies typically print the medication instructions only on the boxes and not directly on the medication tubes.   If your medication is too expensive, please contact our office at [phone] option 4 or send [place] a message through [place].   We are unable to tell what your co-pay for medications will be in advance as this is different depending on your insurance coverage. However, we may be able to find a substitute medication at lower cost or fill out paperwork to get insurance to cover a needed medication.   If a prior authorization is required to get your medication covered by your insurance company, please allow [place] 1-2 business days to complete this process.  Drug prices often vary depending on where the prescription is filled and some pharmacies may offer cheaper prices.  The website www.goodrx.com contains coupons for medications through different pharmacies. The prices  here do not account for what the cost may be with help from insurance (it may be cheaper with your insurance), but the website can give you the price if you did not use any insurance.  - You can print the associated coupon and take it with your prescription to the pharmacy.  - You may also stop by our office during regular business hours and pick up a GoodRx coupon card.  - If you need your prescription sent electronically to a different pharmacy, notify our office through [facility] or by phone at [phone] option 4.     Si Usted Necesita Algo Despus de Su Visita  Tambin puede enviarnos un mensaje a travs de Pharmacist, community. Por lo general respondemos a los mensajes de MyChart en el transcurso de 1 a 2 das hbiles.  Para renovar recetas, por favor pida a su farmacia que se ponga en contacto con nuestra oficina. Harland Dingwall de fax es [place] [phone].  Si  tiene un asunto urgente cuando la clnica est cerrada y que no puede esperar hasta el siguiente da hbil, puede llamar/localizar a su doctor(a) al nmero que aparece a continuacin.   Por favor, tenga en cuenta que aunque hacemos todo lo posible para estar disponibles para asuntos urgentes fuera del horario de [place], no estamos disponibles las 24 horas del da, los 7 das de la [place].   Si tiene un problema urgente y no puede comunicarse con nosotros, puede optar por buscar atencin mdica  en el consultorio de su doctor(a), en una clnica privada, en un centro de atencin urgente o en una sala de emergencias.  Si tiene Engineering geologist, por favor llame inmediatamente al 911 o vaya a la sala de emergencias.  Nmeros de bper  - Dr. Nehemiah Massed: [phone]  - Dra. Moye: [phone]  - Dra. Nicole Kindred: [phone]  En caso de inclemencias del [place], por favor llame a Johnsie Kindred principal al [phone] para una actualizacin sobre el [place] de cualquier retraso o cierre.  Consejos para la medicacin en  dermatologa: Por favor, guarde las cajas en las que vienen los medicamentos de uso tpico para ayudarle a seguir las instrucciones sobre dnde y cmo usarlos. Las farmacias generalmente imprimen las instrucciones del medicamento slo en las cajas y no directamente en los tubos del [place].   Si su medicamento es muy caro, por favor, pngase en contacto con Zigmund Daniel llamando al [phone] y presione la opcin 4 o envenos un mensaje a travs de Pharmacist, community.   No podemos decirle cul ser su copago por los medicamentos por adelantado ya que esto es diferente dependiendo de la cobertura de su seguro. Sin embargo, es posible que podamos encontrar un medicamento sustituto a Electrical engineer un formulario para que el seguro cubra el medicamento que se considera necesario.   Si se requiere una autorizacin previa para que su compaa de seguros [place] su medicamento, por favor permtanos de 1 a 2 das hbiles para completar este proceso.  Los precios de los medicamentos varan con frecuencia dependiendo del Environmental consultant de dnde se surte la receta y alguna farmacias pueden ofrecer precios ms baratos.  El sitio web www.goodrx.com tiene cupones para medicamentos de Airline pilot. Los precios aqu no tienen en cuenta lo que podra costar con la ayuda del seguro (puede ser ms barato con su seguro), pero el sitio web puede darle el precio si no utiliz Research scientist (physical sciences).  - Puede imprimir el cupn correspondiente y llevarlo con su receta a la farmacia.  - Tambin puede pasar por nuestra oficina durante el horario de atencin regular y Charity fundraiser una tarjeta de cupones de GoodRx.  - Si necesita que su receta se enve electrnicamente a una farmacia diferente, informe a nuestra oficina a travs de MyChart de [place] o por telfono llamando al [phone] y presione la opcin 4.

## 2020-12-20 NOTE — Progress Notes (Signed)
Follow-Up Visit   Subjective  Carl Chen is a 80 y.o. male who presents for the following: FBSE (Patient here for full body skin exam and skin cancer screening. Patient with hx of BCC. He does have a spot at right face that was treated in the past with LN2 but has not cleared. ).  The following portions of the chart were reviewed this encounter and updated as appropriate:   Tobacco  Allergies  Meds  Problems  Med Hx  Surg Hx  Fam Hx      Review of Systems:  No other skin or systemic complaints except as noted in HPI or Assessment and Plan.  Objective  Well appearing patient in no apparent distress; mood and affect are within normal limits.  A full examination was performed including scalp, head, eyes, ears, nose, lips, neck, chest, axillae, abdomen, back, buttocks, bilateral upper extremities, bilateral lower extremities, hands, feet, fingers, toes, fingernails, and toenails. All findings within normal limits unless otherwise noted below.  right post shoulder 0.3 cm pink papule with prominent telangiectasia  R/o BCC     Right Temple 0.8 cm scaly pink papule R/o SCC     Right Upper Back x 1, left post shoulder x 1, right forehead x 1, left medial cheek x 1, left lateral cheek x 2, left forearm x 2, mid chest x 1 (9) Erythematous thin papules/macules with gritty scale.   face Smooth white papule(s).   bilateral feet Scaling and maceration web spaces and over distal and lateral soles.    Assessment & Plan  Neoplasm of uncertain behavior of skin (2) right post shoulder  Epidermal / dermal shaving  Lesion diameter (cm):  0.3 Informed consent: discussed and consent obtained   Timeout: patient name, date of birth, surgical site, and procedure verified   Patient was prepped and draped in usual sterile fashion: area prepped with isopropyl alcohol. Anesthesia: the lesion was anesthetized in a standard fashion   Anesthetic:  1% lidocaine w/ epinephrine  1-100,000 buffered w/ 8.4% NaHCO3 Instrument used: flexible razor blade   Hemostasis achieved with: aluminum chloride   Outcome: patient tolerated procedure well   Post-procedure details: wound care instructions given   Additional details:  Mupirocin and a bandage applied  Destruction of lesion  Destruction method: electrodesiccation and curettage   Informed consent: discussed and consent obtained   Timeout:  patient name, date of birth, surgical site, and procedure verified Patient was prepped and draped in usual sterile fashion: area prepped with isopropyl alcohol. Anesthesia: the lesion was anesthetized in a standard fashion   Anesthetic:  1% lidocaine w/ epinephrine 1-100,000 buffered w/ 8.4% NaHCO3 Curettage performed in three different directions: Yes   Electrodesiccation performed over the curetted area: Yes   Curettage cycles:  3 Final wound size (cm):  0.5 Hemostasis achieved with:  electrodesiccation Outcome: patient tolerated procedure well with no complications   Post-procedure details: wound care instructions given   Additional details:  Mupirocin and a pressure dressing applied  Specimen 1 - Surgical pathology Differential Diagnosis: r/o BCC  Check Margins: No 0.3 cm pink papule with prominent telangiectasia  Right Temple  Skin / nail biopsy Type of biopsy: tangential   Informed consent: discussed and consent obtained   Timeout: patient name, date of birth, surgical site, and procedure verified   Patient was prepped and draped in usual sterile fashion: Area prepped with isopropyl alcohol. Anesthesia: the lesion was anesthetized in a standard fashion   Anesthetic:  1% lidocaine  w/ epinephrine 1-100,000 buffered w/ 8.4% NaHCO3 Instrument used: flexible razor blade   Hemostasis achieved with: aluminum chloride   Outcome: patient tolerated procedure well   Post-procedure details: wound care instructions given   Additional details:  Mupirocin and a bandage  applied  Specimen 2 - Surgical pathology Differential Diagnosis: R/o SCC  Check Margins: No 0.8 cm scaly pink papule   AK (actinic keratosis) (9) Right Upper Back x 1, left post shoulder x 1, right forehead x 1, left medial cheek x 1, left lateral cheek x 2, left forearm x 2, mid chest x 1  Prior to procedure, discussed risks of blister formation, small wound, skin dyspigmentation, or rare scar following cryotherapy. Recommend Vaseline ointment to treated areas while healing.  Hypertrophic at left medial cheek  Destruction of lesion - Right Upper Back x 1, left post shoulder x 1, right forehead x 1, left medial cheek x 1, left lateral cheek x 2, left forearm x 2, mid chest x 1  Destruction method: cryotherapy   Informed consent: discussed and consent obtained   Lesion destroyed using liquid nitrogen: Yes   Cryotherapy cycles:  2 Outcome: patient tolerated procedure well with no complications   Post-procedure details: wound care instructions given    [place] face  Benign-appearing.  Observation.  Call clinic for new or changing lesions.  Recommend daily use of broad spectrum spf 30+ sunscreen to sun-exposed areas.    Tinea pedis of both feet bilateral feet  Start ciclopirox cream twice daily  Related Medications ciclopirox (LOPROX) 0.77 % cream Apply topically 2 (two) times daily. Use for 3 weeks and then use only once weekly for occurrence. Apply to entire areas of feet including toenails and in between toes  Lentigines - Scattered tan macules - Due to sun exposure - Benign-appearing, observe - Recommend daily broad spectrum sunscreen SPF 30+ to sun-exposed areas, reapply every 2 hours as needed. - Call for any changes  Seborrheic Keratoses - Stuck-on, waxy, tan-brown papules and/or plaques  - Benign-appearing - Discussed benign etiology and prognosis. - Observe - Call for any changes  Melanocytic Nevi - Tan-brown and/or pink-flesh-colored symmetric macules and  papules - Benign appearing on exam today - Observation - Call clinic for new or changing moles - Recommend daily use of broad spectrum spf 30+ sunscreen to sun-exposed areas.   Hemangiomas - Red papules - Discussed benign nature - Observe - Call for any changes  Actinic Damage - Severe, confluent actinic changes with pre-cancerous actinic keratoses  - Severe, chronic, not at goal, secondary to cumulative UV radiation exposure over time - diffuse scaly erythematous macules and papules with underlying dyspigmentation - Discussed Prescription "Field Treatment" for Severe, Chronic Confluent Actinic Changes with Pre-Cancerous Actinic Keratoses Field treatment involves treatment of an entire area of skin that has confluent Actinic Changes (Sun/ Ultraviolet light damage) and PreCancerous Actinic Keratoses by method of PhotoDynamic Therapy (PDT) and/or prescription Topical Chemotherapy agents such as 5-fluorouracil, 5-fluorouracil/calcipotriene, and/or imiquimod.  The purpose is to decrease the number of clinically evident and subclinical PreCancerous lesions to prevent progression to development of skin cancer by chemically destroying early precancer changes that may or may not be visible.  It has been shown to reduce the risk of developing skin cancer in the treated area. As a result of treatment, redness, scaling, crusting, and open sores may occur during treatment course. One or more than one of these methods may be used and may have to be used several times to control, suppress  and eliminate the PreCancerous changes. Discussed treatment course, expected reaction, and possible side effects. - Recommend daily broad spectrum sunscreen SPF 30+ to sun-exposed areas, reapply every 2 hours as needed.  - Staying in the shade or wearing long sleeves, sun glasses (UVA+UVB protection) and wide brim hats (4-inch brim around the entire circumference of the hat) are also recommended. - Call for new or changing  lesions. - Patient deferred  History of Basal Cell Carcinoma of the Skin - No evidence of recurrence today - Recommend regular full body skin exams - Recommend daily broad spectrum sunscreen SPF 30+ to sun-exposed areas, reapply every 2 hours as needed.  - Call if any new or changing lesions are noted between office visits - Recommend Niacinamide or Nicotinamide 500mg  twice per day to lower risk of non-melanoma skin cancer by approximately 25%. This is usually available at [employer].  Skin cancer screening performed today.  Return in about 3 months (around 03/20/2021) for AK follow up, TBSE 1 year.  Graciella Belton, RMA, am acting as scribe for Forest Gleason, MD .  Documentation: I have reviewed the above documentation for accuracy and completeness, and I agree with the above.  Forest Gleason, MD

## 2020-12-25 ENCOUNTER — Other Ambulatory Visit: Payer: Self-pay

## 2020-12-25 ENCOUNTER — Encounter: Payer: Self-pay | Admitting: Dermatology

## 2020-12-25 DIAGNOSIS — C4492 Squamous cell carcinoma of skin, unspecified: Secondary | ICD-10-CM

## 2021-03-13 ENCOUNTER — Ambulatory Visit: Payer: Medicare Other | Admitting: Dermatology

## 2021-03-25 ENCOUNTER — Telehealth: Payer: Self-pay

## 2021-03-25 NOTE — Telephone Encounter (Signed)
Called patient to offer him appt with Dr. Nicole Kindred this am or tomorrow but he could not hear me on the phone. Will try again later.  ?Lurlean Horns., RMA ?

## 2021-03-26 ENCOUNTER — Ambulatory Visit: Payer: Medicare Other | Admitting: Dermatology

## 2021-03-26 ENCOUNTER — Other Ambulatory Visit: Payer: Self-pay

## 2021-03-26 DIAGNOSIS — R21 Rash and other nonspecific skin eruption: Secondary | ICD-10-CM

## 2021-03-26 DIAGNOSIS — Z85828 Personal history of other malignant neoplasm of skin: Secondary | ICD-10-CM

## 2021-03-26 DIAGNOSIS — Z1283 Encounter for screening for malignant neoplasm of skin: Secondary | ICD-10-CM

## 2021-03-26 DIAGNOSIS — D229 Melanocytic nevi, unspecified: Secondary | ICD-10-CM

## 2021-03-26 DIAGNOSIS — D692 Other nonthrombocytopenic purpura: Secondary | ICD-10-CM

## 2021-03-26 DIAGNOSIS — L700 Acne vulgaris: Secondary | ICD-10-CM

## 2021-03-26 DIAGNOSIS — L821 Other seborrheic keratosis: Secondary | ICD-10-CM

## 2021-03-26 DIAGNOSIS — L814 Other melanin hyperpigmentation: Secondary | ICD-10-CM

## 2021-03-26 DIAGNOSIS — L570 Actinic keratosis: Secondary | ICD-10-CM

## 2021-03-26 DIAGNOSIS — D1800 Hemangioma unspecified site: Secondary | ICD-10-CM

## 2021-03-26 DIAGNOSIS — L820 Inflamed seborrheic keratosis: Secondary | ICD-10-CM

## 2021-03-26 DIAGNOSIS — L578 Other skin changes due to chronic exposure to nonionizing radiation: Secondary | ICD-10-CM

## 2021-03-26 MED ORDER — FLUOROURACIL 5 % EX CREA: TOPICAL_CREAM | Freq: Two times a day (BID) | CUTANEOUS | 0 refills | Status: DC

## 2021-03-26 NOTE — Progress Notes (Signed)
? ?Follow-Up Visit ?  ?Subjective  ?DEL OVERFELT is a 81 y.o. male who presents for the following: Actinic Keratosis (3 month follow up face and trunk treated with LN2) and Follow-up (Biopsy follow up of right temple - SCC treated with Mohs at [facility] 01/2021). ?The patient presents for Upper Body Skin Exam (UBSE) for skin cancer screening and mole check.  The patient has spots, moles and lesions to be evaluated, some may be new or changing and the patient has concerns that these could be cancer. ? ?The following portions of the chart were reviewed this encounter and updated as appropriate:  ? Tobacco  Allergies  Meds  Problems  Med Hx  Surg Hx  Fam Hx   ?  ?Review of Systems:  No other skin or systemic complaints except as noted in HPI or Assessment and Plan. ? ?Objective  ?Well appearing patient in no apparent distress; mood and affect are within normal limits. ? ?All skin waist up examined. ? ?Right chest ?Erythematous stuck-on, waxy papule or plaque ? ?Mid Back ?Pink papules ? ?Face ?Erythematous thin papules/macules with gritty scale.  ? ?Right Upper Cutaneous Lip ?Pink patch ? ? ?Assessment & Plan  ? ?History of Squamous Cell Carcinoma of the Skin ?- No evidence of recurrence today ?- No lymphadenopathy ?- Recommend regular full body skin exams ?- Recommend daily broad spectrum sunscreen SPF 30+ to sun-exposed areas, reapply every 2 hours as needed.  ?- Call if any new or changing lesions are noted between office visits ? ?History of Basal Cell Carcinoma of the Skin ?- No evidence of recurrence today ?- Recommend regular full body skin exams ?- Recommend daily broad spectrum sunscreen SPF 30+ to sun-exposed areas, reapply every 2 hours as needed.  ?- Call if any new or changing lesions are noted between office visits ? ?Purpura - Chronic; persistent and recurrent.  Treatable, but not curable. ?- Violaceous macules and patches ?- Benign ?- Related to trauma, age, sun damage and/or use of blood thinners,  chronic use of topical and/or oral steroids ?- Observe ?- Can use OTC arnica containing moisturizer such as Dermend Bruise Formula if desired ?- Call for worsening or other concerns ? ?Lentigines ?- Scattered tan macules ?- Due to sun exposure ?- Benign-appearing, observe ?- Recommend daily broad spectrum sunscreen SPF 30+ to sun-exposed areas, reapply every 2 hours as needed. ?- Call for any changes ? ?Seborrheic Keratoses ?- Stuck-on, waxy, tan-brown papules and/or plaques  ?- Benign-appearing ?- Discussed benign etiology and prognosis. ?- Observe ?- Call for any changes ? ?Melanocytic Nevi ?- Tan-brown and/or pink-flesh-colored symmetric macules and papules ?- Benign appearing on exam today ?- Observation ?- Call clinic for new or changing moles ?- Recommend daily use of broad spectrum spf 30+ sunscreen to sun-exposed areas.  ? ?Hemangiomas ?- Red papules ?- Discussed benign nature ?- Observe ?- Call for any changes ? ?Skin cancer screening performed today. ? ?Inflamed seborrheic keratosis ?Right chest ? ?Destruction of lesion - Right chest ?Complexity: simple   ?Destruction method: cryotherapy   ?Informed consent: discussed and consent obtained   ?Timeout:  patient name, date of birth, surgical site, and procedure verified ?Lesion destroyed using liquid nitrogen: Yes   ?Region frozen until ice ball extended beyond lesion: Yes   ?Outcome: patient tolerated procedure well with no complications   ?Post-procedure details: wound care instructions given   ? ?Acne vulgaris ?Mid Back ?Acne/Folliculitis ?Discussed low dose doxycycline - patient declines today. ?Recommend CLN wash 2-3 times  per week ? ?AK (actinic keratosis) ?Face ?Actinic Damage - Severe, confluent actinic changes with pre-cancerous actinic keratoses  ?- Severe, chronic, not at goal, secondary to cumulative UV radiation exposure over time ?- diffuse scaly erythematous macules and papules with underlying dyspigmentation ?- Discussed Prescription "Field  Treatment" for Severe, Chronic Confluent Actinic Changes with Pre-Cancerous Actinic Keratoses ?Field treatment involves treatment of an entire area of skin that has confluent Actinic Changes (Sun/ Ultraviolet light damage) and PreCancerous Actinic Keratoses by method of PhotoDynamic Therapy (PDT) and/or prescription Topical Chemotherapy agents such as 5-fluorouracil, 5-fluorouracil/calcipotriene, and/or imiquimod.  The purpose is to decrease the number of clinically evident and subclinical PreCancerous lesions to prevent progression to development of skin cancer by chemically destroying early precancer changes that may or may not be visible.  It has been shown to reduce the risk of developing skin cancer in the treated area. As a result of treatment, redness, scaling, crusting, and open sores may occur during treatment course. One or more than one of these methods may be used and may have to be used several times to control, suppress and eliminate the PreCancerous changes. Discussed treatment course, expected reaction, and possible side effects. ?- Recommend daily broad spectrum sunscreen SPF 30+ to sun-exposed areas, reapply every 2 hours as needed.  ?- Staying in the shade or wearing long sleeves, sun glasses (UVA+UVB protection) and wide brim hats (4-inch brim around the entire circumference of the hat) are also recommended. ?- Call for new or changing lesions. ? ?Start Fluorouracil 5%/Calcipotriene cream bid x 1 week ? ?fluorouracil (EFUDEX) 5 % cream - Face ?Apply topically 2 (two) times daily. X 1 week ? ?Rash - pink patch ?Right Upper Cutaneous Lip ?Likely from recent acne - recheck on follow up ? ?Return in about 3 months (around 06/26/2021). ? ?I, Ashok Cordia, CMA, am acting as scribe for Sarina Ser, MD . ?Documentation: I have reviewed the above documentation for accuracy and completeness, and I agree with the above. ? ?Sarina Ser, MD ? ?

## 2021-03-26 NOTE — Patient Instructions (Signed)

## 2021-03-31 ENCOUNTER — Encounter: Payer: Self-pay | Admitting: Dermatology

## 2021-05-15 ENCOUNTER — Other Ambulatory Visit: Payer: Self-pay

## 2021-05-15 DIAGNOSIS — B353 Tinea pedis: Secondary | ICD-10-CM

## 2021-05-15 MED ORDER — CICLOPIROX OLAMINE 0.77 % EX CREA: TOPICAL_CREAM | Freq: Two times a day (BID) | CUTANEOUS | 2 refills | Status: DC

## 2021-05-15 NOTE — Progress Notes (Signed)
Patient needing a RF of Ciclopirox/Switching pharmacies. aw ?

## 2021-05-28 ENCOUNTER — Telehealth: Payer: Self-pay

## 2021-05-28 NOTE — Telephone Encounter (Signed)
Updating specimen tracking and history from [facility] progress notes/photos. aw ?

## 2021-09-18 ENCOUNTER — Ambulatory Visit: Payer: Medicare Other | Admitting: Dermatology

## 2021-09-18 DIAGNOSIS — L578 Other skin changes due to chronic exposure to nonionizing radiation: Secondary | ICD-10-CM | POA: Diagnosis not present

## 2021-09-18 DIAGNOSIS — Z85828 Personal history of other malignant neoplasm of skin: Secondary | ICD-10-CM

## 2021-09-18 DIAGNOSIS — L739 Follicular disorder, unspecified: Secondary | ICD-10-CM

## 2021-09-18 DIAGNOSIS — D489 Neoplasm of uncertain behavior, unspecified: Secondary | ICD-10-CM

## 2021-09-18 DIAGNOSIS — D099 Carcinoma in situ, unspecified: Secondary | ICD-10-CM

## 2021-09-18 DIAGNOSIS — L570 Actinic keratosis: Secondary | ICD-10-CM | POA: Diagnosis not present

## 2021-09-18 DIAGNOSIS — D044 Carcinoma in situ of skin of scalp and neck: Secondary | ICD-10-CM

## 2021-09-18 DIAGNOSIS — L720 Epidermal cyst: Secondary | ICD-10-CM

## 2021-09-18 HISTORY — DX: Carcinoma in situ, unspecified: D09.9

## 2021-09-18 NOTE — Patient Instructions (Addendum)
For bumps neck chest and body  Can use Head and Shoulders as a body wash to affected areas 3 times weekly. Lather and let sit for 5 - 10 minutes before rinsing     Biopsy Wound Care Instructions  Leave the original bandage on for 24 hours if possible.  If the bandage becomes soaked or soiled before that time, it is OK to remove it and examine the wound.  A small amount of post-operative bleeding is normal.  If excessive bleeding occurs, remove the bandage, place gauze over the site and apply continuous pressure (no peeking) over the area for 30 minutes. If this does not work, please call our clinic as soon as possible or page your doctor if it is after hours.   Once a day, cleanse the wound with soap and water. It is fine to shower. If a thick crust develops you may use a Q-tip dipped into dilute hydrogen peroxide (mix 1:1 with water) to dissolve it.  Hydrogen peroxide can slow the healing process, so use it only as needed.    After washing, apply petroleum jelly (Vaseline) or an antibiotic ointment if your doctor prescribed one for you, followed by a bandage.    For best healing, the wound should be covered with a layer of ointment at all times. If you are not able to keep the area covered with a bandage to hold the ointment in place, this may mean re-applying the ointment several times a day.  Continue this wound care until the wound has healed and is no longer open.   Itching and mild discomfort is normal during the healing process. However, if you develop pain or severe itching, please call our office.   If you have any discomfort, you can take Tylenol (acetaminophen) or ibuprofen as directed on the bottle. (Please do not take these if you have an allergy to them or cannot take them for another reason).  Some redness, tenderness and white or yellow material in the wound is normal healing.  If the area becomes very sore and red, or develops a thick yellow-green material (pus), it may be  infected; please notify us.    If you have stitches, return to clinic as directed to have the stitches removed. You will continue wound care for 2-3 days after the stitches are removed.   Wound healing continues for up to one year following surgery. It is not unusual to experience pain in the scar from time to time during the interval.  If the pain becomes severe or the scar thickens, you should notify the office.    A slight amount of redness in a scar is expected for the first six months.  After six months, the redness will fade and the scar will soften and fade.  The color difference becomes less noticeable with time.  If there are any problems, return for a post-op surgery check at your earliest convenience.  To improve the appearance of the scar, you can use silicone scar gel, cream, or sheets (such as Mederma or Serica) every night for up to one year. These are available over the counter (without a prescription).  Please call our office at [phone] for any questions or concerns.    Start whenever you have any special events will start cream in January   Use 2 rounds at face  use twice daily at 4 days  wait 4 - 6 weeks between treatments until completely healed before restarting cream at face   Use  1 round at forearms and hands for twice daily for 7 days   - Start 5-fluorouracil/calcipotriene cream twice daily for 4 days to areas of face except around eyes, nose, and mouth. Then once healed can use cream twice daily for 7 days at forearms and hands.   Prescription sent to [facility]. Patient advised they will receive an email to purchase the medication online and have it sent to their home. Patient provided with handout reviewing treatment course and side effects and advised to call or message [place] on MyChart with any concerns.    Instructions for [facility] Medications  One or more of your medications was sent to the [facility] mail order  [facility] pharmacy. You will receive an email from them and can purchase the medicine through that link. It will then be mailed to your home at the address you confirmed. If for any reason you do not receive an email from them, please check your spam folder. If you still do not find the email, please let us know. [facility] phone number is [phone].    5-Fluorouracil/Calcipotriene Patient Education   Actinic keratoses are the dry, red scaly spots on the skin caused by sun damage. A portion of these spots can turn into skin cancer with time, and treating them can help prevent development of skin cancer.   Treatment of these spots requires removal of the defective skin cells. There are various ways to remove actinic keratoses, including freezing with liquid nitrogen, treatment with creams, or treatment with a blue light procedure in the office.   5-fluorouracil cream is a topical cream used to treat actinic keratoses. It works by interfering with the growth of abnormal fast-growing skin cells, such as actinic keratoses. These cells peel off and are replaced by healthy ones.   5-fluorouracil/calcipotriene is a combination of the 5-fluorouracil cream with a vitamin D analog cream called calcipotriene. The calcipotriene alone does not treat actinic keratoses. However, when it is combined with 5-fluorouracil, it helps the 5-fluorouracil treat the actinic keratoses much faster so that the same results can be achieved with a much shorter treatment time.  INSTRUCTIONS FOR 5-FLUOROURACIL/CALCIPOTRIENE CREAM:   5-fluorouracil/calcipotriene cream typically only needs to be used for 4-7 days. A thin layer should be applied twice a day to the treatment areas recommended by your physician.   If your physician prescribed you separate tubes of 5-fluourouracil and calcipotriene, apply a thin layer of 5-fluorouracil followed by a thin layer of calcipotriene.   Avoid contact with your eyes, nostrils, and  mouth. Do not use 5-fluorouracil/calcipotriene cream on infected or open wounds.   You will develop redness, irritation and some crusting at areas where you have pre-cancer damage/actinic keratoses. IF YOU DEVELOP PAIN, BLEEDING, OR SIGNIFICANT CRUSTING, STOP THE TREATMENT EARLY - you have already gotten a good response and the actinic keratoses should clear up well.  Wash your hands after applying 5-fluorouracil 5% cream on your skin.   A moisturizer or sunscreen with a minimum SPF 30 should be applied each morning.   Once you have finished the treatment, you can apply a thin layer of Vaseline twice a day to irritated areas to soothe and calm the areas more quickly. If you experience significant discomfort, contact your physician.  For some patients it is necessary to repeat the treatment for best results.  SIDE EFFECTS: When using 5-fluorouracil/calcipotriene cream, you may have mild irritation, such as redness, dryness, swelling, or a mild burning sensation. This usually resolves  within 2 weeks. The more actinic keratoses you have, the more redness and inflammation you can expect during treatment. Eye irritation has been reported rarely. If this occurs, please let us know.  If you have any trouble using this cream, please call the office. If you have any other questions about this information, please do not hesitate to ask me before you leave the office.     Actinic keratoses are precancerous spots that appear secondary to cumulative UV radiation exposure/sun exposure over time. They are chronic with expected duration over 1 year. A portion of actinic keratoses will progress to squamous cell carcinoma of the skin. It is not possible to reliably predict which spots will progress to skin cancer and so treatment is recommended to prevent development of skin cancer.  Recommend daily broad spectrum sunscreen SPF 30+ to sun-exposed areas, reapply every 2 hours as needed.  Recommend staying in the  shade or wearing long sleeves, sun glasses (UVA+UVB protection) and wide brim hats (4-inch brim around the entire circumference of the hat). Call for new or changing lesions.   Cryotherapy Aftercare  Wash gently with soap and water everyday.   Apply Vaseline and Band-Aid daily until healed.      Due to recent changes in healthcare laws, you may see results of your pathology and/or laboratory studies on MyChart before the doctors have had a chance to review them. We understand that in some cases there may be results that are confusing or concerning to you. Please understand that not all results are received at the same time and often the doctors may need to interpret multiple results in order to provide you with the best plan of care or course of treatment. Therefore, we ask that you please give [place] 2 business days to thoroughly review all your results before contacting the office for clarification. Should we see a critical lab result, you will be contacted sooner.   If You Need Anything After Your Visit  If you have any questions or concerns for your doctor, please call our main line at [phone] and press option 4 to reach your doctor's medical assistant. If no one answers, please leave a voicemail as directed and we will return your call as soon as possible. Messages left after 4 pm will be answered the following business day.   You may also send [place] a message via [place]. We typically respond to MyChart messages within 1-2 business days.  For prescription refills, please ask your pharmacy to contact our office. Our fax number is [phone].  If you have an urgent issue when the clinic is closed that cannot wait until the next business day, you can page your doctor at the number below.    Please note that while we do our best to be available for urgent issues outside of office hours, we are not available 24/7.   If you have an urgent issue and are unable to reach [place], you may choose to  seek medical care at your doctor's office, retail clinic, urgent care center, or emergency room.  If you have a medical emergency, please immediately call 911 or go to the emergency department.  Pager Numbers  - Dr. Nehemiah Massed: [phone]  - Dr. Laurence Ferrari: [phone]  - Dr. Nicole Kindred: [phone]  In the event of inclement weather, please call our main line at [phone] for an update on the status of any delays or closures.  Dermatology Medication Tips: Please keep the boxes that topical medications come in in order  to help keep track of the instructions about where and how to use these. Pharmacies typically print the medication instructions only on the boxes and not directly on the medication tubes.   If your medication is too expensive, please contact our office at [phone] option 4 or send [place] a message through [place].   We are unable to tell what your co-pay for medications will be in advance as this is different depending on your insurance coverage. However, we may be able to find a substitute medication at lower cost or fill out paperwork to get insurance to cover a needed medication.   If a prior authorization is required to get your medication covered by your insurance company, please allow [place] 1-2 business days to complete this process.  Drug prices often vary depending on where the prescription is filled and some pharmacies may offer cheaper prices.  The website www.goodrx.com contains coupons for medications through different pharmacies. The prices here do not account for what the cost may be with help from insurance (it may be cheaper with your insurance), but the website can give you the price if you did not use any insurance.  - You can print the associated coupon and take it with your prescription to the pharmacy.  - You may also stop by our office during regular business hours and pick up a GoodRx coupon card.  - If you need your prescription sent electronically to a  different pharmacy, notify our office through [facility] or by phone at [phone] option 4.     Si Usted Necesita Algo Despus de Su Visita  Tambin puede enviarnos un mensaje a travs de Pharmacist, community. Por lo general respondemos a los mensajes de MyChart en el transcurso de 1 a 2 das hbiles.  Para renovar recetas, por favor pida a su farmacia que se ponga en contacto con nuestra oficina. Harland Dingwall de fax es [place] [phone].  Si tiene un asunto urgente cuando la clnica est cerrada y que no puede esperar hasta el siguiente da hbil, puede llamar/localizar a su doctor(a) al nmero que aparece a continuacin.   Por favor, tenga en cuenta que aunque hacemos todo lo posible para estar disponibles para asuntos urgentes fuera del horario de [place], no estamos disponibles las 24 horas del da, los 7 das de la [place].   Si tiene un problema urgente y no puede comunicarse con nosotros, puede optar por buscar atencin mdica  en el consultorio de su doctor(a), en una clnica privada, en un centro de atencin urgente o en una sala de emergencias.  Si tiene Engineering geologist, por favor llame inmediatamente al 911 o vaya a la sala de emergencias.  Nmeros de bper  - Dr. Nehemiah Massed: [phone]  - Dra. Moye: [phone]  - Dra. Nicole Kindred: [phone]  En caso de inclemencias del [place], por favor llame a Johnsie Kindred principal al [phone] para una actualizacin sobre el [place] de cualquier retraso o cierre.  Consejos para la medicacin en dermatologa: Por favor, guarde las cajas en las que vienen los medicamentos de uso tpico para ayudarle a seguir las instrucciones sobre dnde y cmo usarlos. Las farmacias generalmente imprimen las instrucciones del medicamento slo en las cajas y no directamente en los tubos del [place].   Si su medicamento es muy caro, por favor, pngase en contacto con Zigmund Daniel llamando al [phone] y presione la opcin 4 o envenos un  mensaje a travs de Pharmacist, community.   No podemos decirle cul ser su copago  por los medicamentos por adelantado ya que esto es diferente dependiendo de la cobertura de su seguro. Sin embargo, es posible que podamos encontrar un medicamento sustituto a Electrical engineer un formulario para que el seguro cubra el medicamento que se considera necesario.   Si se requiere una autorizacin previa para que su compaa de seguros [place] su medicamento, por favor permtanos de 1 a 2 das hbiles para completar este proceso.  Los precios de los medicamentos varan con frecuencia dependiendo del Environmental consultant de dnde se surte la receta y alguna farmacias pueden ofrecer precios ms baratos.  El sitio web www.goodrx.com tiene cupones para medicamentos de Airline pilot. Los precios aqu no tienen en cuenta lo que podra costar con la ayuda del seguro (puede ser ms barato con su seguro), pero el sitio web puede darle el precio si no utiliz Research scientist (physical sciences).  - Puede imprimir el cupn correspondiente y llevarlo con su receta a la farmacia.  - Tambin puede pasar por nuestra oficina durante el horario de atencin regular y Charity fundraiser una tarjeta de cupones de GoodRx.  - Si necesita que su receta se enve electrnicamente a una farmacia diferente, informe a nuestra oficina a travs de MyChart de [place] o por telfono llamando al [phone] y presione la opcin 4.

## 2021-09-18 NOTE — Progress Notes (Signed)
Follow-Up Visit   Subjective  Carl Chen is a 81 y.o. male who presents for the following: Actinic Keratosis (Ak follow up for sun exposed areas, patient was prescribed 5 fluorouracil cream to use at face but reports decided not to use cream. He has history of bcc ).  The patient has spots, moles and lesions to be evaluated, some may be new or changing and the patient has concerns that these could be cancer.  The following portions of the chart were reviewed this encounter and updated as appropriate:  Tobacco  Allergies  Meds  Problems  Med Hx  Surg Hx  Fam Hx      Review of Systems: No other skin or systemic complaints except as noted in HPI or Assessment and Plan.   Objective  Well appearing patient in no apparent distress; mood and affect are within normal limits.  A focused examination was performed including face, neck, ears, eyes, chest, arms and hands. Relevant physical exam findings are noted in the Assessment and Plan.  left neck 0.9 cm pink papule        left zygoma x 1, left jaw x 1, left helix x 1, right lower eyelid x 1 (4) Erythematous thin papules/macules with gritty scale.   Right Upper Eyelid Subcutaneous nodule.   neck, chest, body Perifollicular erythematous papules and pustules    Assessment & Plan  Neoplasm of uncertain behavior left neck  Skin / nail biopsy Type of biopsy: tangential   Informed consent: discussed and consent obtained   Timeout: patient name, date of birth, surgical site, and procedure verified   Patient was prepped and draped in usual sterile fashion: Area prepped with isopropyl alcohol. Anesthesia: the lesion was anesthetized in a standard fashion   Anesthetic:  1% lidocaine w/ epinephrine 1-100,000 buffered w/ 8.4% NaHCO3 Instrument used: flexible razor blade   Hemostasis achieved with: aluminum chloride   Outcome: patient tolerated procedure well   Post-procedure details: wound care instructions given    Additional details:  Mupirocin and a bandage applied  Specimen 1 - Surgical pathology Differential Diagnosis: r/o scc vs bcc  Check Margins: No  R/o SCC vs BCC   Actinic keratosis (4) left zygoma x 1, left jaw x 1, left helix x 1, right lower eyelid x 1  Actinic keratoses are precancerous spots that appear secondary to cumulative UV radiation exposure/sun exposure over time. They are chronic with expected duration over 1 year. A portion of actinic keratoses will progress to squamous cell carcinoma of the skin. It is not possible to reliably predict which spots will progress to skin cancer and so treatment is recommended to prevent development of skin cancer.  Recommend daily broad spectrum sunscreen SPF 30+ to sun-exposed areas, reapply every 2 hours as needed.  Recommend staying in the shade or wearing long sleeves, sun glasses (UVA+UVB protection) and wide brim hats (4-inch brim around the entire circumference of the hat). Call for new or changing lesions.  Destruction of lesion - left zygoma x 1, left jaw x 1, left helix x 1, right lower eyelid x 1  Destruction method: cryotherapy   Informed consent: discussed and consent obtained   Lesion destroyed using liquid nitrogen: Yes   Cryotherapy cycles:  2 Outcome: patient tolerated procedure well with no complications   Post-procedure details: wound care instructions given   Additional details:  Prior to procedure, discussed risks of blister formation, small wound, skin dyspigmentation, or rare scar following cryotherapy. Recommend Vaseline ointment to  treated areas while healing.   Epidermal inclusion cyst Right Upper Eyelid  Benign-appearing. Exam most consistent with an epidermal inclusion cyst. Discussed that a cyst is a benign growth that can grow over time and sometimes get irritated or inflamed. Recommend observation if it is not bothersome. Discussed option of surgical excision to remove it if it is growing, symptomatic, or  other changes noted. Please call for new or changing lesions so they can be evaluated.    Folliculitis neck, chest, body  Recommend Head & Shoulders (pyrithione zinc) shampoo - use as a body wash at aa's of body, lather on areas , let sit on skin  5 - 10 minutes  and then rinse.     Actinic Damage - Severe, confluent actinic changes with pre-cancerous actinic keratoses  - Severe, chronic, not at goal, secondary to cumulative UV radiation exposure over time - diffuse scaly erythematous macules and papules with underlying dyspigmentation - Discussed Prescription "Field Treatment" for Severe, Chronic Confluent Actinic Changes with Pre-Cancerous Actinic Keratoses Field treatment involves treatment of an entire area of skin that has confluent Actinic Changes (Sun/ Ultraviolet light damage) and PreCancerous Actinic Keratoses by method of PhotoDynamic Therapy (PDT) and/or prescription Topical Chemotherapy agents such as 5-fluorouracil, 5-fluorouracil/calcipotriene, and/or imiquimod.  The purpose is to decrease the number of clinically evident and subclinical PreCancerous lesions to prevent progression to development of skin cancer by chemically destroying early precancer changes that may or may not be visible.  It has been shown to reduce the risk of developing skin cancer in the treated area. As a result of treatment, redness, scaling, crusting, and open sores may occur during treatment course. One or more than one of these methods may be used and may have to be used several times to control, suppress and eliminate the PreCancerous changes. Discussed treatment course, expected reaction, and possible side effects. - Recommend daily broad spectrum sunscreen SPF 30+ to sun-exposed areas, reapply every 2 hours as needed.  - Staying in the shade or wearing long sleeves, sun glasses (UVA+UVB protection) and wide brim hats (4-inch brim around the entire circumference of the hat) are also recommended. - Call for  new or changing lesions. Patient will start in January  - Start 5-fluorouracil/calcipotriene cream twice a day for  to areas of face except around eyes, nose, and mouth. Then once healed can use cream twice daily for 7 days at forearms and hands.  Prescription sent to [facility]. Patient advised they will receive an email to purchase the medication online and have it sent to their home. Patient provided with handout reviewing treatment course and side effects and advised to call or message [place] on MyChart with any concerns. Recommend 2 rounds at face - wait 4 - 6 weeks between treatments until completley healed before restarting cream at face.   History of Basal Cell Carcinoma of the Skin - No evidence of recurrence today - Recommend regular full body skin exams - Recommend daily broad spectrum sunscreen SPF 30+ to sun-exposed areas, reapply every 2 hours as needed.  - Call if any new or changing lesions are noted between office visits  History of Squamous Cell Carcinoma of the Skin - No evidence of recurrence today - No lymphadenopathy - Recommend regular full body skin exams - Recommend daily broad spectrum sunscreen SPF 30+ to sun-exposed areas, reapply every 2 hours as needed.  - Call if any new or changing lesions are noted between office visits  Return in about 3  months (around 12/19/2021) for  TBSE. I, Ruthell Rummage, CMA, am acting as scribe for Forest Gleason, MD.  Documentation: I have reviewed the above documentation for accuracy and completeness, and I agree with the above.  Forest Gleason, MD

## 2021-09-24 ENCOUNTER — Telehealth: Payer: Self-pay

## 2021-09-24 NOTE — Telephone Encounter (Signed)
Discussed biopsy results with pt, return for [facility]

## 2021-09-24 NOTE — Telephone Encounter (Addendum)
Tried calling patient regarding results and to schedule treatment. No answer. LMOM for patient to call office.   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 Thank you!

## 2021-09-24 NOTE — Telephone Encounter (Signed)
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 Thank you!

## 2021-09-30 ENCOUNTER — Encounter: Payer: Self-pay | Admitting: Dermatology

## 2021-10-01 ENCOUNTER — Ambulatory Visit: Payer: Medicare Other | Admitting: Dermatology

## 2021-10-01 DIAGNOSIS — L578 Other skin changes due to chronic exposure to nonionizing radiation: Secondary | ICD-10-CM

## 2021-10-01 DIAGNOSIS — L570 Actinic keratosis: Secondary | ICD-10-CM

## 2021-10-01 DIAGNOSIS — D099 Carcinoma in situ, unspecified: Secondary | ICD-10-CM

## 2021-10-01 DIAGNOSIS — D044 Carcinoma in situ of skin of scalp and neck: Secondary | ICD-10-CM

## 2021-10-01 NOTE — Patient Instructions (Addendum)
Wound Care Instructions  Cleanse wound gently with soap and water once a day then pat dry with clean gauze. Apply a thin coat of Petrolatum (petroleum jelly, "Vaseline") over the wound (unless you have an allergy to this). We recommend that you use a new, sterile tube of Vaseline. Do not pick or remove scabs. Do not remove the yellow or white "healing tissue" from the base of the wound.  Cover the wound with fresh, clean, nonstick gauze and secure with paper tape. You may use Band-Aids in place of gauze and tape if the wound is small enough, but would recommend trimming much of the tape off as there is often too much. Sometimes Band-Aids can irritate the skin.  You should call the office for your biopsy report after 1 week if you have not already been contacted.  If you experience any problems, such as abnormal amounts of bleeding, swelling, significant bruising, significant pain, or evidence of infection, please call the office immediately.  FOR ADULT SURGERY PATIENTS: If you need something for pain relief you may take 1 extra strength Tylenol (acetaminophen) AND 2 Ibuprofen (200mg each) together every 4 hours as needed for pain. (do not take these if you are allergic to them or if you have a reason you should not take them.) Typically, you may only need pain medication for 1 to 3 days.     Due to recent changes in healthcare laws, you may see results of your pathology and/or laboratory studies on MyChart before the doctors have had a chance to review them. We understand that in some cases there may be results that are confusing or concerning to you. Please understand that not all results are received at the same time and often the doctors may need to interpret multiple results in order to provide you with the best plan of care or course of treatment. Therefore, we ask that you please give us 2 business days to thoroughly review all your results before contacting the office for clarification. Should  we see a critical lab result, you will be contacted sooner.   If You Need Anything After Your Visit  If you have any questions or concerns for your doctor, please call our main line at [phone] and press option 4 to reach your doctor's medical assistant. If no one answers, please leave a voicemail as directed and we will return your call as soon as possible. Messages left after 4 pm will be answered the following business day.   You may also send us a message via MyChart. We typically respond to MyChart messages within 1-2 business days.  For prescription refills, please ask your pharmacy to contact our office. Our fax number is [phone].  If you have an urgent issue when the clinic is closed that cannot wait until the next business day, you can page your doctor at the number below.    Please note that while we do our best to be available for urgent issues outside of office hours, we are not available 24/7.   If you have an urgent issue and are unable to reach us, you may choose to seek medical care at your doctor's office, retail clinic, urgent care center, or emergency room.  If you have a medical emergency, please immediately call 911 or go to the emergency department.  Pager Numbers  - Dr. Kowalski: [phone]  - Dr. Moye: [phone]  - Dr. Stewart: [phone]  In the event of inclement weather, please call our main line at   [phone] for an update on the status of any delays or closures.  Dermatology Medication Tips: Please keep the boxes that topical medications come in in order to help keep track of the instructions about where and how to use these. Pharmacies typically print the medication instructions only on the boxes and not directly on the medication tubes.   If your medication is too expensive, please contact our office at [phone] option 4 or send us a message through MyChart.   We are unable to tell what your co-pay for medications will be in  advance as this is different depending on your insurance coverage. However, we may be able to find a substitute medication at lower cost or fill out paperwork to get insurance to cover a needed medication.   If a prior authorization is required to get your medication covered by your insurance company, please allow us 1-2 business days to complete this process.  Drug prices often vary depending on where the prescription is filled and some pharmacies may offer cheaper prices.  The website www.goodrx.com contains coupons for medications through different pharmacies. The prices here do not account for what the cost may be with help from insurance (it may be cheaper with your insurance), but the website can give you the price if you did not use any insurance.  - You can print the associated coupon and take it with your prescription to the pharmacy.  - You may also stop by our office during regular business hours and pick up a GoodRx coupon card.  - If you need your prescription sent electronically to a different pharmacy, notify our office through [place] MyChart or by phone at [phone] option 4.     Si Usted Necesita Algo Despus de Su Visita  Tambin puede enviarnos un mensaje a travs de MyChart. Por lo general respondemos a los mensajes de MyChart en el transcurso de 1 a 2 das hbiles.  Para renovar recetas, por favor pida a su farmacia que se ponga en contacto con nuestra oficina. Nuestro nmero de fax es el [phone].  Si tiene un asunto urgente cuando la clnica est cerrada y que no puede esperar hasta el siguiente da hbil, puede llamar/localizar a su doctor(a) al nmero que aparece a continuacin.   Por favor, tenga en cuenta que aunque hacemos todo lo posible para estar disponibles para asuntos urgentes fuera del horario de oficina, no estamos disponibles las 24 horas del da, los 7 das de la semana.   Si tiene un problema urgente y no puede comunicarse con nosotros, puede  optar por buscar atencin mdica  en el consultorio de su doctor(a), en una clnica privada, en un centro de atencin urgente o en una sala de emergencias.  Si tiene una emergencia mdica, por favor llame inmediatamente al 911 o vaya a la sala de emergencias.  Nmeros de bper  - Dr. Kowalski: [phone]  - Dra. Moye: [phone]  - Dra. Stewart: [phone]  En caso de inclemencias del tiempo, por favor llame a nuestra lnea principal al [phone] para una actualizacin sobre el estado de cualquier retraso o cierre.  Consejos para la medicacin en dermatologa: Por favor, guarde las cajas en las que vienen los medicamentos de uso tpico para ayudarle a seguir las instrucciones sobre dnde y cmo usarlos. Las farmacias generalmente imprimen las instrucciones del medicamento slo en las cajas y no directamente en los tubos del medicamento.   Si su medicamento es muy caro, por favor, pngase en contacto con   nuestra oficina llamando al [phone] y presione la opcin 4 o envenos un mensaje a travs de MyChart.   No podemos decirle cul ser su copago por los medicamentos por adelantado ya que esto es diferente dependiendo de la cobertura de su seguro. Sin embargo, es posible que podamos encontrar un medicamento sustituto a menor costo o llenar un formulario para que el seguro cubra el medicamento que se considera necesario.   Si se requiere una autorizacin previa para que su compaa de seguros cubra su medicamento, por favor permtanos de 1 a 2 das hbiles para completar este proceso.  Los precios de los medicamentos varan con frecuencia dependiendo del lugar de dnde se surte la receta y alguna farmacias pueden ofrecer precios ms baratos.  El sitio web www.goodrx.com tiene cupones para medicamentos de diferentes farmacias. Los precios aqu no tienen en cuenta lo que podra costar con la ayuda del seguro (puede ser ms barato con su seguro), pero el sitio web puede darle el  precio si no utiliz ningn seguro.  - Puede imprimir el cupn correspondiente y llevarlo con su receta a la farmacia.  - Tambin puede pasar por nuestra oficina durante el horario de atencin regular y recoger una tarjeta de cupones de GoodRx.  - Si necesita que su receta se enve electrnicamente a una farmacia diferente, informe a nuestra oficina a travs de MyChart de  o por telfono llamando al [phone] y presione la opcin 4.  

## 2021-10-01 NOTE — Progress Notes (Signed)
Follow-Up Visit   Subjective  Carl Chen is a 81 y.o. 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 The patient has spots on his face to be evaluated, some may be new or changing.    The following portions of the chart were reviewed this encounter and updated as appropriate:   Tobacco  Allergies  Meds  Problems  Med Hx  Surg Hx  Fam Hx      Review of Systems:  No other skin or systemic complaints except as noted in HPI or Assessment and Plan.  Objective  Well appearing patient in no apparent distress; mood and affect are within normal limits.  A focused examination was performed including left neck . Relevant physical exam findings are noted in the Assessment and Plan.  left neck Keratotic pink papule/nodule or plaque.   face Erythematous thin papules/macules with gritty scale.     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  Destruction of lesion  Destruction method: electrodesiccation and curettage   Informed consent: discussed and consent obtained   Timeout:  patient name, date of birth, surgical site, and procedure verified Anesthesia: the lesion was anesthetized in a standard fashion   Anesthetic:  1% lidocaine w/ epinephrine 1-100,000 buffered w/ 8.4% NaHCO3 Curettage performed in three different directions: Yes   Electrodesiccation performed over the curetted area: Yes   Curettage cycles:  3 Final wound size (cm):  1.5 Hemostasis achieved with:  electrodesiccation Outcome: patient tolerated procedure well with no complications   Post-procedure details: sterile dressing applied and wound care instructions given   Dressing type: petrolatum    AK (actinic keratosis) face  Actinic keratoses are precancerous spots that appear secondary to cumulative UV radiation exposure/sun exposure over time. They are chronic with expected duration over 1 year. A  portion of actinic keratoses will progress to squamous cell carcinoma of the skin. It is not possible to reliably predict which spots will progress to skin cancer and so treatment is recommended to prevent development of skin cancer.  Recommend daily broad spectrum sunscreen SPF 30+ to sun-exposed areas, reapply every 2 hours as needed.  Recommend staying in the shade or wearing long sleeves, sun glasses (UVA+UVB protection) and wide brim hats (4-inch brim around the entire circumference of the hat). Call for new or changing lesions.   Pt declines treatment today- we will reevaluate at follow up visit in December Monitor for changes   Actinic Damage - Severe, confluent actinic changes with pre-cancerous actinic keratoses  - Severe, chronic, not at goal, secondary to cumulative UV radiation exposure over time - diffuse scaly erythematous macules and papules with underlying dyspigmentation - Discussed Prescription "Field Treatment" for Severe, Chronic Confluent Actinic Changes with Pre-Cancerous Actinic Keratoses Field treatment involves treatment of an entire area of skin that has confluent Actinic Changes (Sun/ Ultraviolet light damage) and PreCancerous Actinic Keratoses by method of PhotoDynamic Therapy (PDT) and/or prescription Topical Chemotherapy agents such as 5-fluorouracil, 5-fluorouracil/calcipotriene, and/or imiquimod.  The purpose is to decrease the number of clinically evident and subclinical PreCancerous lesions to prevent progression to development of skin cancer by chemically destroying early precancer changes that may or may not be visible.  It has been shown to reduce the risk of developing skin cancer in the treated area. As a result of treatment, redness, scaling, crusting, and open sores may occur during treatment course. One or more than one  of these methods may be used and may have to be used several times to control, suppress and eliminate the PreCancerous changes. Discussed  treatment course, expected reaction, and possible side effects. - Recommend daily broad spectrum sunscreen SPF 30+ to sun-exposed areas, reapply every 2 hours as needed.  - Staying in the shade or wearing long sleeves, sun glasses (UVA+UVB protection) and wide brim hats (4-inch brim around the entire circumference of the hat) are also recommended. - Call for new or changing lesions.   May consider 5FU calcipotriene at follow up visit   Return for scheduled appt  Dec 26, 2021.  I, Marye Round, CMA, am acting as scribe for Forest Gleason, MD .   Documentation: I have reviewed the above documentation for accuracy and completeness, and I agree with the above.  Forest Gleason, MD

## 2021-10-01 NOTE — Progress Notes (Deleted)
   Follow-Up Visit   Subjective  Carl Chen is a 81 y.o. male who presents for the following: Follow-up.  ***  The following portions of the chart were reviewed this encounter and updated as appropriate:       Review of Systems:  No other skin or systemic complaints except as noted in HPI or Assessment and Plan.  Objective  Well appearing patient in no apparent distress; mood and affect are within normal limits.  {[id number]::"J full examination was performed including scalp, head, eyes, ears, nose, lips, neck, chest, axillae, abdomen, back, buttocks, bilateral upper extremities, bilateral lower extremities, hands, feet, fingers, toes, fingernails, and toenails. All findings within normal limits unless otherwise noted below."}    Assessment & Plan   No follow-ups on file.    Follow-Up Visit   Subjective  Carl Chen is a 81 y.o. male who presents for the following: Follow-up.  ***  The following portions of the chart were reviewed this encounter and updated as appropriate:       Review of Systems:  No other skin or systemic complaints except as noted in HPI or Assessment and Plan.  Objective  Well appearing patient in no apparent distress; mood and affect are within normal limits.  {[id number]::"H full examination was performed including scalp, head, eyes, ears, nose, lips, neck, chest, axillae, abdomen, back, buttocks, bilateral upper extremities, bilateral lower extremities, hands, feet, fingers, toes, fingernails, and toenails. All findings within normal limits unless otherwise noted below."}    Assessment & Plan   No follow-ups on file.

## 2021-10-07 ENCOUNTER — Encounter: Payer: Self-pay | Admitting: Dermatology

## 2021-10-09 ENCOUNTER — Other Ambulatory Visit: Payer: Self-pay | Admitting: Dermatology

## 2021-10-09 DIAGNOSIS — B353 Tinea pedis: Secondary | ICD-10-CM

## 2021-12-26 ENCOUNTER — Ambulatory Visit: Payer: Medicare Other | Admitting: Dermatology

## 2021-12-26 ENCOUNTER — Encounter: Payer: Self-pay | Admitting: Dermatology

## 2021-12-26 DIAGNOSIS — L739 Follicular disorder, unspecified: Secondary | ICD-10-CM

## 2021-12-26 DIAGNOSIS — D692 Other nonthrombocytopenic purpura: Secondary | ICD-10-CM

## 2021-12-26 DIAGNOSIS — Z1283 Encounter for screening for malignant neoplasm of skin: Secondary | ICD-10-CM

## 2021-12-26 DIAGNOSIS — B353 Tinea pedis: Secondary | ICD-10-CM

## 2021-12-26 DIAGNOSIS — Z85828 Personal history of other malignant neoplasm of skin: Secondary | ICD-10-CM

## 2021-12-26 DIAGNOSIS — L578 Other skin changes due to chronic exposure to nonionizing radiation: Secondary | ICD-10-CM

## 2021-12-26 DIAGNOSIS — L570 Actinic keratosis: Secondary | ICD-10-CM | POA: Diagnosis not present

## 2021-12-26 DIAGNOSIS — L821 Other seborrheic keratosis: Secondary | ICD-10-CM

## 2021-12-26 DIAGNOSIS — D229 Melanocytic nevi, unspecified: Secondary | ICD-10-CM

## 2021-12-26 DIAGNOSIS — L814 Other melanin hyperpigmentation: Secondary | ICD-10-CM

## 2021-12-26 NOTE — Patient Instructions (Addendum)
Cryotherapy Aftercare  Wash gently with soap and water everyday.   Apply Vaseline daily until healed.   Feet:  Start OTC Terbinafine cream twice daily until clear then one additional week, apply once weekly for prevention.     Recommend daily broad spectrum sunscreen SPF 30+ to sun-exposed areas, reapply every 2 hours as needed. Call for new or changing lesions.  Staying in the shade or wearing long sleeves, sun glasses (UVA+UVB protection) and wide brim hats (4-inch brim around the entire circumference of the hat) are also recommended for sun protection.    Melanoma ABCDEs  Melanoma is the most dangerous type of skin cancer, and is the leading cause of death from skin disease.  You are more likely to develop melanoma if you: Have light-colored skin, light-colored eyes, or red or blond hair Spend a lot of time in the sun Tan regularly, either outdoors or in a tanning bed Have had blistering sunburns, especially during childhood Have a close family member who has had a melanoma Have atypical moles or large birthmarks  Early detection of melanoma is key since treatment is typically straightforward and cure rates are extremely high if we catch it early.   The first sign of melanoma is often a change in a mole or a new dark spot.  The ABCDE system is a way of remembering the signs of melanoma.  A for asymmetry:  The two halves do not match. B for border:  The edges of the growth are irregular. C for color:  A mixture of colors are present instead of an even brown color. D for diameter:  Melanomas are usually (but not always) greater than 35m - the size of a pencil eraser. E for evolution:  The spot keeps changing in size, shape, and color.  Please check your skin once per month between visits. You can use a small mirror in front and a large mirror behind you to keep an eye on the back side or your body.   If you see any new or changing lesions before your next follow-up, please call  to schedule a visit.  Please continue daily skin protection including broad spectrum sunscreen SPF 30+ to sun-exposed areas, reapplying every 2 hours as needed when you're outdoors.   Staying in the shade or wearing long sleeves, sun glasses (UVA+UVB protection) and wide brim hats (4-inch brim around the entire circumference of the hat) are also recommended for sun protection.    Due to recent changes in healthcare laws, you may see results of your pathology and/or laboratory studies on MyChart before the doctors have had a chance to review them. We understand that in some cases there may be results that are confusing or concerning to you. Please understand that not all results are received at the same time and often the doctors may need to interpret multiple results in order to provide you with the best plan of care or course of treatment. Therefore, we ask that you please give u[place]2 business days to thoroughly review all your results before contacting the office for clarification. Should we see a critical lab result, you will be contacted sooner.   If You Need Anything After Your Visit  If you have any questions or concerns for your doctor, please call our main line at [phone] press option 4 to reach your doctor's medical assistant. If no one answers, please leave a voicemail as directed and we will return your call as soon as possible. Messages left  after 4 pm will be answered the following business day.   You may also send [place] a message via [place]. We typically respond to MyChart messages within 1-2 business days.  For prescription refills, please ask your pharmacy to contact our office. Our fax number is [phone].  If you have an urgent issue when the clinic is closed that cannot wait until the next business day, you can page your doctor at the number below.    Please note that while we do our best to be available for urgent issues outside of office hours, we are not available  24/7.   If you have an urgent issue and are unable to reach [place], you may choose to seek medical care at your doctor's office, retail clinic, urgent care center, or emergency room.  If you have a medical emergency, please immediately call 911 or go to the emergency department.  Pager Numbers  - Dr. Nehemiah Massed: [phone]  - Dr. Laurence Ferrari: [phone]  - Dr. Nicole Kindred: [phone]  In the event of inclement weather, please call our main line at [phone] for an update on the status of any delays or closures.  Dermatology Medication Tips: Please keep the boxes that topical medications come in in order to help keep track of the instructions about where and how to use these. Pharmacies typically print the medication instructions only on the boxes and not directly on the medication tubes.   If your medication is too expensive, please contact our office at [phone] option 4 or send [place] a message through [place].   We are unable to tell what your co-pay for medications will be in advance as this is different depending on your insurance coverage. However, we may be able to find a substitute medication at lower cost or fill out paperwork to get insurance to cover a needed medication.   If a prior authorization is required to get your medication covered by your insurance company, please allow [place] 1-2 business days to complete this process.  Drug prices often vary depending on where the prescription is filled and some pharmacies may offer cheaper prices.  The website www.goodrx.com contains coupons for medications through different pharmacies. The prices here do not account for what the cost may be with help from insurance (it may be cheaper with your insurance), but the website can give you the price if you did not use any insurance.  - You can print the associated coupon and take it with your prescription to the pharmacy.  - You may also stop by our office during regular business hours and pick up  a GoodRx coupon card.  - If you need your prescription sent electronically to a different pharmacy, notify our office through [facility] or by phone at [phone] option 4.     Si Usted Necesita Algo Despus de Su Visita  Tambin puede enviarnos un mensaje a travs de Pharmacist, community. Por lo general respondemos a los mensajes de MyChart en el transcurso de 1 a 2 das hbiles.  Para renovar recetas, por favor pida a su farmacia que se ponga en contacto con nuestra oficina. Harland Dingwall de fax es [place] [phone].  Si tiene un asunto urgente cuando la clnica est cerrada y que no puede esperar hasta el siguiente da hbil, puede llamar/localizar a su doctor(a) al nmero que aparece a continuacin.   Por favor, tenga en cuenta que aunque hacemos todo lo posible para estar disponibles para asuntos urgentes fuera del horario de oficina, no estamos disponibles  las 24 horas del da, los 7 das de la [place].   Si tiene un problema urgente y no puede comunicarse con nosotros, puede optar por buscar atencin mdica  en el consultorio de su doctor(a), en una clnica privada, en un centro de atencin urgente o en una sala de emergencias.  Si tiene Engineering geologist, por favor llame inmediatamente al 911 o vaya a la sala de emergencias.  Nmeros de bper  - Dr. Nehemiah Massed: [phone]  - Dra. Moye: [phone]  - Dra. Nicole Kindred: [phone]  En caso de inclemencias del [place], por favor llame a Johnsie Kindred principal al [phone] para una actualizacin sobre el [place] de cualquier retraso o cierre.  Consejos para la medicacin en dermatologa: Por favor, guarde las cajas en las que vienen los medicamentos de uso tpico para ayudarle a seguir las instrucciones sobre dnde y cmo usarlos. Las farmacias generalmente imprimen las instrucciones del medicamento slo en las cajas y no directamente en los tubos del [place].   Si su medicamento es muy caro, por favor, pngase en contacto  con Zigmund Daniel llamando al [phone] y presione la opcin 4 o envenos un mensaje a travs de Pharmacist, community.   No podemos decirle cul ser su copago por los medicamentos por adelantado ya que esto es diferente dependiendo de la cobertura de su seguro. Sin embargo, es posible que podamos encontrar un medicamento sustituto a Electrical engineer un formulario para que el seguro cubra el medicamento que se considera necesario.   Si se requiere una autorizacin previa para que su compaa de seguros [place] su medicamento, por favor permtanos de 1 a 2 das hbiles para completar este proceso.  Los precios de los medicamentos varan con frecuencia dependiendo del Environmental consultant de dnde se surte la receta y alguna farmacias pueden ofrecer precios ms baratos.  El sitio web www.goodrx.com tiene cupones para medicamentos de Airline pilot. Los precios aqu no tienen en cuenta lo que podra costar con la ayuda del seguro (puede ser ms barato con su seguro), pero el sitio web puede darle el precio si no utiliz Research scientist (physical sciences).  - Puede imprimir el cupn correspondiente y llevarlo con su receta a la farmacia.  - Tambin puede pasar por nuestra oficina durante el horario de atencin regular y Charity fundraiser una tarjeta de cupones de GoodRx.  - Si necesita que su receta se enve electrnicamente a una farmacia diferente, informe a nuestra oficina a travs de MyChart de [place] o por telfono llamando al [phone] y presione la opcin 4.

## 2021-12-26 NOTE — Progress Notes (Signed)
Follow-Up Visit   Subjective  Carl Chen is a 81 y.o. male who presents for the following: Annual Exam (HxSCC's, HxBCC's).  The patient presents for Total-Body Skin Exam (TBSE) for skin cancer screening and mole check.  The patient has spots, moles and lesions to be evaluated, some may be new or changing and the patient has concerns that these could be cancer.  The following portions of the chart were reviewed this encounter and updated as appropriate:  Tobacco  Allergies  Meds  Problems  Med Hx  Surg Hx  Fam Hx      Review of Systems: No other skin or systemic complaints except as noted in HPI or Assessment and Plan.   Objective  Well appearing patient in no apparent distress; mood and affect are within normal limits.  A full examination was performed including scalp, head, eyes, ears, nose, lips, neck, chest, axillae, abdomen, back, buttocks, bilateral upper extremities, bilateral lower extremities, hands, feet, fingers, toes, fingernails, and toenails. All findings within normal limits unless otherwise noted below.  L post shoulder x1, L-3 finger x1, L hand x6, L wrist x1, L forearm x3, R-4 finger x1, R hand x2, R forearm x4, R lat chest x1, R eyebrow x1, R forehead x1, L upper forehead x4, L triangular fossa x1, L cheek x1, L med cheek x3, R lower lip verm. x1 (32) Erythematous thin papules/macules with gritty scale.   chest Follicular-based erythematous papules and pustules.   Feet Scaling and maceration web spaces and over distal and lateral soles.   Right Wrist - Posterior x1, Right lateral cheek x1 (2) Erythematous hypertrophic papules with gritty scale.    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 - No evidence of recurrence today - Recommend regular full body skin exams - Recommend daily broad spectrum sunscreen SPF 30+ to sun-exposed areas, reapply every 2 hours as needed.  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  - Call if any new or changing lesions are noted between office visits  Lentigines - Scattered tan macules - Due to sun exposure - Benign-appearing, observe - Recommend daily broad spectrum sunscreen SPF 30+ to sun-exposed areas, reapply every 2 hours as needed. - Call for any changes  Seborrheic Keratoses - Stuck-on, waxy, tan-brown papules and/or plaques  - Benign-appearing - Discussed benign etiology and prognosis. - Observe - Call for any changes  Melanocytic Nevi - Tan-brown and/or pink-flesh-colored symmetric macules and papules - Benign appearing on exam today - Observation - Call clinic for new or changing moles - Recommend daily use of broad spectrum spf 30+ sunscreen to sun-exposed areas.   Hemangiomas - Red papules - Discussed benign nature - Observe - Call for any changes  Actinic Damage - Chronic condition, secondary to cumulative UV/sun exposure - diffuse scaly erythematous macules with underlying dyspigmentation - Recommend daily broad spectrum sunscreen SPF 30+ to sun-exposed areas, reapply every 2 hours as needed.  - Staying in the shade or wearing long sleeves, sun glasses (UVA+UVB protection) and wide brim hats (4-inch brim around the entire circumference of the hat) are also recommended for sun  protection.  - Call for new or changing lesions.  Skin cancer screening performed today.  Purpura - Chronic; persistent and recurrent.  Treatable, but not curable. - Violaceous macules and patches - Benign - Related to trauma, age, sun damage and/or use of blood thinners, chronic use of topical and/or oral  steroids - Observe - Can use OTC arnica containing moisturizer such as Dermend Bruise Formula if desired - Call for worsening or other concerns   AK (actinic keratosis) (32) L post shoulder x1, L-3 finger x1, L hand x6, L wrist x1, L forearm x3, R-4 finger x1, R hand x2, R forearm x4, R lat chest x1, R eyebrow x1, R forehead x1, L upper forehead x4, L triangular fossa x1, L cheek x1, L med cheek x3, R lower lip verm. x1  Actinic keratoses are precancerous spots that appear secondary to cumulative UV radiation exposure/sun exposure over time. They are chronic with expected duration over 1 year. A portion of actinic keratoses will progress to squamous cell carcinoma of the skin. It is not possible to reliably predict which spots will progress to skin cancer and so treatment is recommended to prevent development of skin cancer.  Recommend daily broad spectrum sunscreen SPF 30+ to sun-exposed areas, reapply every 2 hours as needed.  Recommend staying in the shade or wearing long sleeves, sun glasses (UVA+UVB protection) and wide brim hats (4-inch brim around the entire circumference of the hat). Call for new or changing lesions.  Destruction of lesion - L post shoulder x1, L-3 finger x1, L hand x6, L wrist x1, L forearm x3, R-4 finger x1, R hand x2, R forearm x4, R lat chest x1, R eyebrow x1, R forehead x1, L upper forehead x4, L triangular fossa x1, L cheek x1, L med cheek x3, R lower lip verm. x1  Destruction method: cryotherapy   Informed consent: discussed and consent obtained   Lesion destroyed using liquid nitrogen: Yes   Region frozen until ice ball extended beyond lesion: Yes   Outcome: patient tolerated procedure well with no complications   Post-procedure details: wound care instructions given   Additional details:  Prior to procedure, discussed risks of blister formation, small wound, skin dyspigmentation, or rare scar following cryotherapy. Recommend Vaseline ointment to treated areas  while healing.   Folliculitis chest  Recommend using Cln Sport Wash daily, leave on for 1-2 minutes before rinsing off. This can be purchased at [employer] or online.    Tinea pedis of right foot Feet  Start OTC Terbinafine cream twice daily until clear then one additional week, apply once weekly for prevention.   Hypertrophic actinic keratosis (2) Right Wrist - Posterior x1, Right lateral cheek x1  Actinic keratoses are precancerous spots that appear secondary to cumulative UV radiation exposure/sun exposure over time. They are chronic with expected duration over 1 year. A portion of actinic keratoses will progress to squamous cell carcinoma of the skin. It is not possible to reliably predict which spots will progress to skin cancer and so treatment is recommended to prevent development of skin cancer.  Recommend daily broad spectrum sunscreen SPF 30+ to sun-exposed areas, reapply every 2 hours as needed.  Recommend staying in the shade or wearing long sleeves, sun glasses (UVA+UVB protection) and wide brim hats (4-inch brim around the entire circumference of the hat). Call for new or changing lesions.  Destruction of lesion - Right Wrist - Posterior x1, Right lateral cheek x1  Destruction method: cryotherapy   Informed  consent: discussed and consent obtained   Lesion destroyed using liquid nitrogen: Yes   Region frozen until ice ball extended beyond lesion: Yes   Outcome: patient tolerated procedure well with no complications   Post-procedure details: wound care instructions given   Additional details:  Prior to procedure, discussed risks of blister formation, small wound, skin dyspigmentation, or rare scar following cryotherapy. Recommend Vaseline ointment to treated areas while healing.    Return for AK Follow Up 3-4 months.  I, Emelia Salisbury, CMA, am acting as scribe for Forest Gleason, MD.  Documentation: I have reviewed the above documentation for accuracy and  completeness, and I agree with the above.  Forest Gleason, MD

## 2022-01-08 ENCOUNTER — Encounter: Payer: Self-pay | Admitting: Dermatology

## 2022-04-09 ENCOUNTER — Ambulatory Visit: Payer: Medicare Other | Admitting: Dermatology

## 2022-04-09 ENCOUNTER — Encounter: Payer: Self-pay | Admitting: Dermatology

## 2022-04-09 VITALS — BP 96/62 | HR 77

## 2022-04-09 DIAGNOSIS — L739 Follicular disorder, unspecified: Secondary | ICD-10-CM | POA: Diagnosis not present

## 2022-04-09 DIAGNOSIS — L309 Dermatitis, unspecified: Secondary | ICD-10-CM | POA: Diagnosis not present

## 2022-04-09 DIAGNOSIS — D225 Melanocytic nevi of trunk: Secondary | ICD-10-CM

## 2022-04-09 DIAGNOSIS — D492 Neoplasm of unspecified behavior of bone, soft tissue, and skin: Secondary | ICD-10-CM

## 2022-04-09 DIAGNOSIS — L821 Other seborrheic keratosis: Secondary | ICD-10-CM | POA: Diagnosis not present

## 2022-04-09 DIAGNOSIS — L570 Actinic keratosis: Secondary | ICD-10-CM

## 2022-04-09 MED ORDER — TRIAMCINOLONE ACETONIDE 0.1 % EX OINT: TOPICAL_OINTMENT | CUTANEOUS | 2 refills | Status: DC

## 2022-04-09 MED ORDER — CLINDAMYCIN PHOSPHATE 1 % EX GEL: CUTANEOUS | 3 refills | Status: DC

## 2022-04-09 NOTE — Progress Notes (Signed)
Follow-Up Visit   Subjective  Carl Chen is a 82 y.o. male who presents for the following: Actinic keratosis 3-4 month follow up. Tx with LN2 at last visit.   The patient has spots, moles and lesions to be evaluated, some may be new or changing and the patient has concerns that these could be cancer.  The following portions of the chart were reviewed this encounter and updated as appropriate: medications, allergies, medical history  Review of Systems:  No other skin or systemic complaints except as noted in HPI or Assessment and Plan.  Objective  Well appearing patient in no apparent distress; mood and affect are within normal limits.   A focused examination was performed of the following areas: Head, neck, torso, arms, hands  Right Upper Arm - Anterior Scattered scaly pink papules and xerosis  chest, back, scalp Perifollicular erythematous papules and pustules  Left Lateral Chest 0.8 cm pink and brown thin papule       Assessment & Plan   Seborrheic Keratoses - Stuck-on, waxy, tan-brown papules and/or plaques  - Benign-appearing - Discussed benign etiology and prognosis. - Observe - Call for any changes  ACTINIC KERATOSIS  Actinic keratoses are precancerous spots that appear secondary to cumulative UV radiation exposure/sun exposure over time. They are chronic with expected duration over 1 year. A portion of actinic keratoses will progress to squamous cell carcinoma of the skin. It is not possible to reliably predict which spots will progress to skin cancer and so treatment is recommended to prevent development of skin cancer.  Recommend daily broad spectrum sunscreen SPF 30+ to sun-exposed areas, reapply every 2 hours as needed.  Recommend staying in the shade or wearing long sleeves, sun glasses (UVA+UVB protection) and wide brim hats (4-inch brim around the entire circumference of the hat). Call for new or changing lesions.  Prior to procedure,  discussed risks of blister formation, small wound, skin dyspigmentation, or rare scar following cryotherapy. Recommend Vaseline ointment to treated areas while healing.  Destruction Procedure Note Destruction method: cryotherapy   Informed consent: discussed and consent obtained   Lesion destroyed using liquid nitrogen: Yes   Cryotherapy cycles:  2 Outcome: patient tolerated procedure well with no complications   Post-procedure details: wound care instructions given   Locations: left upper back x1, left mid back x1, left upper arm x2 (hypertrophic), right helix x2, right cheek x1, left nose x1, left forehead x1, left angle of mandible (hypertrophic) x2 # of Lesions Treated: 11   Dermatitis Right Upper Arm - Anterior  Apply triamcinolone 0.1% ointment twice a day as needed for rash to affected areas. Avoid applying to face, groin, and axilla. Use as directed. Long-term use can cause thinning of the skin.  Topical steroids (such as triamcinolone, fluocinolone, fluocinonide, mometasone, clobetasol, halobetasol, betamethasone, hydrocortisone) can cause thinning and lightening of the skin if they are used for too long in the same area. Your physician has selected the right strength medicine for your problem and area affected on the body. Please use your medication only as directed by your physician to prevent side effects.    triamcinolone ointment (KENALOG) 0.1 % - Right Upper Arm - Anterior Apply twice daily to affected areas as needed for rash. Avoid applying to face, groin, and axilla.  Folliculitis chest, back, scalp  Perifollicular erythematous papules and pustules  Recommend pyrithione zinc shampoo used as body wash  Start clindamycin gel twice a day as needed  Apply triamcinolone 0.1% ointment twice a  day as needed to affected areas. Avoid applying to face, groin, and axilla. Use as directed. Long-term use can cause thinning of the skin.  Topical steroids (such as triamcinolone,  fluocinolone, fluocinonide, mometasone, clobetasol, halobetasol, betamethasone, hydrocortisone) can cause thinning and lightening of the skin if they are used for too long in the same area. Your physician has selected the right strength medicine for your problem and area affected on the body. Please use your medication only as directed by your physician to prevent side effects.    clindamycin (CLINDAGEL) 1 % gel - chest, back, scalp Apply twice daily to chest, back, scalp as needed  Neoplasm of skin Left Lateral Chest  Epidermal / dermal shaving  Lesion diameter (cm):  0.8 Informed consent: discussed and consent obtained   Patient was prepped and draped in usual sterile fashion: Area prepped with alcohol. Anesthesia: the lesion was anesthetized in a standard fashion   Anesthetic:  1% lidocaine w/ epinephrine 1-100,000 buffered w/ 8.4% NaHCO3 Instrument used: flexible razor blade   Hemostasis achieved with: pressure, aluminum chloride and electrodesiccation   Outcome: patient tolerated procedure well   Post-procedure details: wound care instructions given   Post-procedure details comment:  Ointment and small bandage applied  Specimen 1 - Surgical pathology Differential Diagnosis: R/O Atypia  Check Margins: No     Return in about 3 months (around 07/10/2022) for AK Follow Up, TBSE.  I, Emelia Salisbury, CMA, am acting as scribe for Forest Gleason, MD.    Documentation: I have reviewed the above documentation for accuracy and completeness, and I agree with the above.  Forest Gleason, MD

## 2022-04-09 NOTE — Patient Instructions (Addendum)
Face, scalp, chest: Recommend pyrithione zinc shampoo used as body wash  Start clindamycin gel twice a day as needed  Apply triamcinolone 0.1% ointment twice a day as needed to affected areas. Avoid applying to face, groin, and axilla. Use as directed. Long-term use can cause thinning of the skin.  Topical steroids (such as triamcinolone, fluocinolone, fluocinonide, mometasone, clobetasol, halobetasol, betamethasone, hydrocortisone) can cause thinning and lightening of the skin if they are used for too long in the same area. Your physician has selected the right strength medicine for your problem and area affected on the body. Please use your medication only as directed by your physician to prevent side effects.    Dermatitis on arm/body areas: Apply triamcinolone 0.1% ointment twice a day as needed for rash to affected areas. Avoid applying to face, groin, and axilla. Use as directed. Long-term use can cause thinning of the skin.  Topical steroids (such as triamcinolone, fluocinolone, fluocinonide, mometasone, clobetasol, halobetasol, betamethasone, hydrocortisone) can cause thinning and lightening of the skin if they are used for too long in the same area. Your physician has selected the right strength medicine for your problem and area affected on the body. Please use your medication only as directed by your physician to prevent side effects.     Cryotherapy Aftercare  Wash gently with soap and water everyday.   Apply Vaseline and Band-Aid daily until healed.   Wound Care Instructions  Cleanse wound gently with soap and water once a day then pat dry with clean gauze. Apply a thin coat of Petrolatum (petroleum jelly, "Vaseline") over the wound (unless you have an allergy to this). We recommend that you use a new, sterile tube of Vaseline. Do not pick or remove scabs. Do not remove the yellow or white "healing tissue" from the base of the wound.  Cover the wound with fresh, clean, nonstick  gauze and secure with paper tape. You may use Band-Aids in place of gauze and tape if the wound is small enough, but would recommend trimming much of the tape off as there is often too much. Sometimes Band-Aids can irritate the skin.  You should call the office for your biopsy report after 1 week if you have not already been contacted.  If you experience any problems, such as abnormal amounts of bleeding, swelling, significant bruising, significant pain, or evidence of infection, please call the office immediately.  FOR ADULT SURGERY PATIENTS: If you need something for pain relief you may take 1 extra strength Tylenol (acetaminophen) AND 2 Ibuprofen (200mg  each) together every 4 hours as needed for pain. (do not take these if you are allergic to them or if you have a reason you should not take them.) Typically, you may only need pain medication for 1 to 3 days.     Gentle Skin Care Guide  1. Bathe no more than once a day.  2. Avoid bathing in hot water  3. Use a mild soap like Dove, Vanicream, Cetaphil, CeraVe. Can use Lever 2000 or Cetaphil antibacterial soap  4. Use soap only where you need it. On most days, use it under your arms, between your legs, and on your feet. Let the water rinse other areas unless visibly dirty.  5. When you get out of the bath/shower, use a towel to gently blot your skin dry, don't rub it.  6. While your skin is still a little damp, apply a moisturizing cream such as Vanicream, CeraVe, Cetaphil, Eucerin, Sarna lotion or plain Vaseline Jelly. For  hands apply Neutrogena [place] Hand Cream or Excipial Hand Cream.  7. Reapply moisturizer any time you start to itch or feel dry.  8. Sometimes using free and clear laundry detergents can be helpful. Fabric softener sheets should be avoided. Downy Free & Gentle liquid, or any liquid fabric softener that is free of dyes and perfumes, it acceptable to use  9. If your doctor has given you prescription creams you may  apply moisturizers over them       Due to recent changes in healthcare laws, you may see results of your pathology and/or laboratory studies on MyChart before the doctors have had a chance to review them. We understand that in some cases there may be results that are confusing or concerning to you. Please understand that not all results are received at the same time and often the doctors may need to interpret multiple results in order to provide you with the best plan of care or course of treatment. Therefore, we ask that you please give [place] 2 business days to thoroughly review all your results before contacting the office for clarification. Should we see a critical lab result, you will be contacted sooner.   If You Need Anything After Your Visit  If you have any questions or concerns for your doctor, please call our main line at [phone] and press option 4 to reach your doctor's medical assistant. If no one answers, please leave a voicemail as directed and we will return your call as soon as possible. Messages left after 4 pm will be answered the following business day.   You may also send [place] a message via [place]. We typically respond to MyChart messages within 1-2 business days.  For prescription refills, please ask your pharmacy to contact our office. Our fax number is [phone].  If you have an urgent issue when the clinic is closed that cannot wait until the next business day, you can page your doctor at the number below.    Please note that while we do our best to be available for urgent issues outside of office hours, we are not available 24/7.   If you have an urgent issue and are unable to reach [place], you may choose to seek medical care at your doctor's office, retail clinic, urgent care center, or emergency room.  If you have a medical emergency, please immediately call 911 or go to the emergency department.  Pager Numbers  - Dr. Nehemiah Massed: [phone]  - Dr. Laurence Ferrari:  [phone]  - Dr. Nicole Kindred: [phone]  In the event of inclement weather, please call our main line at [phone] for an update on the status of any delays or closures.  Dermatology Medication Tips: Please keep the boxes that topical medications come in in order to help keep track of the instructions about where and how to use these. Pharmacies typically print the medication instructions only on the boxes and not directly on the medication tubes.   If your medication is too expensive, please contact our office at [phone] option 4 or send [place] a message through [place].   We are unable to tell what your co-pay for medications will be in advance as this is different depending on your insurance coverage. However, we may be able to find a substitute medication at lower cost or fill out paperwork to get insurance to cover a needed medication.   If a prior authorization is required to get your medication covered by your insurance company, please allow [place] 1-2 business  days to complete this process.  Drug prices often vary depending on where the prescription is filled and some pharmacies may offer cheaper prices.  The website www.goodrx.com contains coupons for medications through different pharmacies. The prices here do not account for what the cost may be with help from insurance (it may be cheaper with your insurance), but the website can give you the price if you did not use any insurance.  - You can print the associated coupon and take it with your prescription to the pharmacy.  - You may also stop by our office during regular business hours and pick up a GoodRx coupon card.  - If you need your prescription sent electronically to a different pharmacy, notify our office through [facility] or by phone at [phone] option 4.     Si Usted Necesita Algo Despus de Su Visita  Tambin puede enviarnos un mensaje a travs de Pharmacist, community. Por lo general respondemos a los mensajes de  MyChart en el transcurso de 1 a 2 das hbiles.  Para renovar recetas, por favor pida a su farmacia que se ponga en contacto con nuestra oficina. Harland Dingwall de fax es [place] [phone].  Si tiene un asunto urgente cuando la clnica est cerrada y que no puede esperar hasta el siguiente da hbil, puede llamar/localizar a su doctor(a) al nmero que aparece a continuacin.   Por favor, tenga en cuenta que aunque hacemos todo lo posible para estar disponibles para asuntos urgentes fuera del horario de [place], no estamos disponibles las 24 horas del da, los 7 das de la [place].   Si tiene un problema urgente y no puede comunicarse con nosotros, puede optar por buscar atencin mdica  en el consultorio de su doctor(a), en una clnica privada, en un centro de atencin urgente o en una sala de emergencias.  Si tiene Engineering geologist, por favor llame inmediatamente al 911 o vaya a la sala de emergencias.  Nmeros de bper  - Dr. Nehemiah Massed: [phone]  - Dra. Moye: [phone]  - Dra. Nicole Kindred: [phone]  En caso de inclemencias del [place], por favor llame a Johnsie Kindred principal al [phone] para una actualizacin sobre el [place] de cualquier retraso o cierre.  Consejos para la medicacin en dermatologa: Por favor, guarde las cajas en las que vienen los medicamentos de uso tpico para ayudarle a seguir las instrucciones sobre dnde y cmo usarlos. Las farmacias generalmente imprimen las instrucciones del medicamento slo en las cajas y no directamente en los tubos del [place].   Si su medicamento es muy caro, por favor, pngase en contacto con Zigmund Daniel llamando al [phone] y presione la opcin 4 o envenos un mensaje a travs de Pharmacist, community.   No podemos decirle cul ser su copago por los medicamentos por adelantado ya que esto es diferente dependiendo de la cobertura de su seguro. Sin embargo, es posible que podamos encontrar un medicamento sustituto a Contractor un formulario para que el seguro cubra el medicamento que se considera necesario.   Si se requiere una autorizacin previa para que su compaa de seguros [place] su medicamento, por favor permtanos de 1 a 2 das hbiles para completar este proceso.  Los precios de los medicamentos varan con frecuencia dependiendo del Environmental consultant de dnde se surte la receta y alguna farmacias pueden ofrecer precios ms baratos.  El sitio web www.goodrx.com tiene cupones para medicamentos de Airline pilot. Los precios aqu no tienen en cuenta lo que podra costar con la  ayuda del seguro (puede ser ms barato con su seguro), pero el sitio web puede darle el precio si no Field seismologist.  - Puede imprimir el cupn correspondiente y llevarlo con su receta a la farmacia.  - Tambin puede pasar por nuestra oficina durante el horario de atencin regular y Charity fundraiser una tarjeta de cupones de GoodRx.  - Si necesita que su receta se enve electrnicamente a una farmacia diferente, informe a nuestra oficina a travs de MyChart de [place] o por telfono llamando al [phone] y presione la opcin 4.

## 2022-04-15 ENCOUNTER — Telehealth: Payer: Self-pay

## 2022-04-15 NOTE — Telephone Encounter (Signed)
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 Lurlean Horns., RMA

## 2022-04-15 NOTE — Telephone Encounter (Signed)
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 Thank you!

## 2022-06-24 ENCOUNTER — Ambulatory Visit: Payer: Medicare Other | Admitting: Dermatology

## 2022-06-24 VITALS — BP 105/64 | HR 69

## 2022-06-24 DIAGNOSIS — Z872 Personal history of diseases of the skin and subcutaneous tissue: Secondary | ICD-10-CM

## 2022-06-24 DIAGNOSIS — L578 Other skin changes due to chronic exposure to nonionizing radiation: Secondary | ICD-10-CM | POA: Diagnosis not present

## 2022-06-24 DIAGNOSIS — L570 Actinic keratosis: Secondary | ICD-10-CM

## 2022-06-24 DIAGNOSIS — W908XXA Exposure to other nonionizing radiation, initial encounter: Secondary | ICD-10-CM

## 2022-06-24 DIAGNOSIS — L111 Transient acantholytic dermatosis [Grover]: Secondary | ICD-10-CM

## 2022-06-24 DIAGNOSIS — L309 Dermatitis, unspecified: Secondary | ICD-10-CM

## 2022-06-24 DIAGNOSIS — Z85828 Personal history of other malignant neoplasm of skin: Secondary | ICD-10-CM

## 2022-06-24 DIAGNOSIS — B353 Tinea pedis: Secondary | ICD-10-CM

## 2022-06-24 DIAGNOSIS — Z86007 Personal history of in-situ neoplasm of skin: Secondary | ICD-10-CM

## 2022-06-24 DIAGNOSIS — L814 Other melanin hyperpigmentation: Secondary | ICD-10-CM

## 2022-06-24 DIAGNOSIS — D229 Melanocytic nevi, unspecified: Secondary | ICD-10-CM

## 2022-06-24 DIAGNOSIS — L821 Other seborrheic keratosis: Secondary | ICD-10-CM

## 2022-06-24 DIAGNOSIS — Z1283 Encounter for screening for malignant neoplasm of skin: Secondary | ICD-10-CM | POA: Diagnosis not present

## 2022-06-24 DIAGNOSIS — X32XXXA Exposure to sunlight, initial encounter: Secondary | ICD-10-CM

## 2022-06-24 DIAGNOSIS — D1801 Hemangioma of skin and subcutaneous tissue: Secondary | ICD-10-CM

## 2022-06-24 MED ORDER — TRIAMCINOLONE ACETONIDE 0.1 % EX OINT
TOPICAL_OINTMENT | CUTANEOUS | 2 refills | Status: DC
Start: 2022-06-24 — End: 2023-01-22

## 2022-06-24 NOTE — Patient Instructions (Addendum)
For fungus at feet  Found over the counter at drug store Apply lamisil (terbinafine) cream twice a day until rash is clear and for one week after.  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 Use for up to 2 weeks as needed. Avoid applying to face, groin, and axilla. Use as directed. Long-term use can cause thinning of the skin.  Topical steroids (such as triamcinolone, fluocinolone, fluocinonide, mometasone, clobetasol, halobetasol, betamethasone, hydrocortisone) can cause thinning and lightening of the skin if they are used for too long in the same area. Your physician has selected the right strength medicine for your problem and area affected on the body. Please use your medication only as directed by your physician to prevent side effects.    Recommend daily broad spectrum sunscreen SPF 30+ to sun-exposed areas, reapply every 2 hours as needed.  Recommend staying in the shade or wearing long sleeves, sun glasses (UVA+UVB protection) and wide brim hats (4-inch brim around the entire circumference of the hat). Call for new or changing lesions.    Recommend discussing with PCP if he could try Niacinamide or Nicotinamide 500mg  twice per day to lower risk of non-melanoma skin cancer by approximately 25%. Discussed this could possibly affect his blood sugar so he would need to closely monitor his diabetes when starting it. This is usually available at [employer].  Cryotherapy Aftercare  Wash gently with soap and water everyday.   Apply Vaseline and Band-Aid daily until healed.      Photodynamic Therapy/Red Light Therapy  Actinic keratoses are the dry, red scaly spots on the skin caused by sun damage. A portion of these spots can turn into skin cancer with time, and treating them can help prevent development of skin cancer.   Treatment of these spots requires removal of the defective skin cells. There are  various ways to remove actinic keratoses, including freezing with liquid nitrogen, treatment with creams, or treatment with a blue light procedure in the office.   Photodynamic Therapy (PDT), also known as "blue light therapy" is an in office procedure used to treat actinic keratoses. It works by targeting precancerous cells. After treatment, these cells peel off and are replaced by healthy ones.   For your phototherapy appointment, you will have two appointments on the day of your treatment. The first appointment will be to apply a cream to the treatment area. You will leave this cream on for 1-2 hours depending on the area being treated. The second appointment will be to shine a blue light on the area for 16 minutes to kill off the precancer cells. It is common to experience a burning sensation during the treatment.  After your treatment, it will be important to keep the treated areas of skin out of the sun completely for 48-72 hours (2-3 days) to prevent having a reaction.   Common side effects include: - Burning or stinging, which may be severe and can last up to 24-72 hours after your treatment - Scaling and crusting which may last up to 2 weeks - Redness, swelling and/or peeling which can last up to 4 weeks  To Care for Your Skin After PDT/Blue Light Therapy: - Wash with soap, water and shampoo as normal. - If needed, you can use cold compresses (e.g. ice packs) for comfort - If okay with your primary care doctor, you may use analgesics such as acetaminophen (tylenol) every 4-6 hours, not to  exceed recommended dose - You may apply Cerave Healing Ointment, Vaseline or Aquaphor as needed - If you have a lot of swelling you may take a Benadryl to help with this (this may cause drowsiness), not to exceed recommended dose. This may increase the risk of falls in people over 65 and may slow reaction time while driving, so it is not recommended to take before driving or operating machinery. - Sun  Precautions - Wear a wide brim hat for the next week if outside  - Wear a sunblock with zinc or titanium dioxide at least SPF 50 daily  If you have any questions or concerns, please call the office and ask to speak with a nurse.   --------------------------------------------------------------------------------------------------------------    Melanoma ABCDEs  Melanoma is the most dangerous type of skin cancer, and is the leading cause of death from skin disease.  You are more likely to develop melanoma if you: Have light-colored skin, light-colored eyes, or red or blond hair Spend a lot of time in the sun Tan regularly, either outdoors or in a tanning bed Have had blistering sunburns, especially during childhood Have a close family member who has had a melanoma Have atypical moles or large birthmarks  Early detection of melanoma is key since treatment is typically straightforward and cure rates are extremely high if we catch it early.   The first sign of melanoma is often a change in a mole or a new dark spot.  The ABCDE system is a way of remembering the signs of melanoma.  A for asymmetry:  The two halves do not match. B for border:  The edges of the growth are irregular. C for color:  A mixture of colors are present instead of an even brown color. D for diameter:  Melanomas are usually (but not always) greater than 6mm - the size of a pencil eraser. E for evolution:  The spot keeps changing in size, shape, and color.  Please check your skin once per month between visits. You can use a small mirror in front and a large mirror behind you to keep an eye on the back side or your body.   If you see any new or changing lesions before your next follow-up, please call to schedule a visit.  Please continue daily skin protection including broad spectrum sunscreen SPF 30+ to sun-exposed areas, reapplying every 2 hours as needed when you're outdoors.   Staying in the shade or wearing long  sleeves, sun glasses (UVA+UVB protection) and wide brim hats (4-inch brim around the entire circumference of the hat) are also recommended for sun protection.    Due to recent changes in healthcare laws, you may see results of your pathology and/or laboratory studies on MyChart before the doctors have had a chance to review them. We understand that in some cases there may be results that are confusing or concerning to you. Please understand that not all results are received at the same time and often the doctors may need to interpret multiple results in order to provide you with the best plan of care or course of treatment. Therefore, we ask that you please give [place] 2 business days to thoroughly review all your results before contacting the office for clarification. Should we see a critical lab result, you will be contacted sooner.   If You Need Anything After Your Visit  If you have any questions or concerns for your doctor, please call our main line at [phone] and press option 4  to reach your doctor's medical assistant. If no one answers, please leave a voicemail as directed and we will return your call as soon as possible. Messages left after 4 pm will be answered the following business day.   You may also send [place] a message via MyChart. We typically respond to MyChart messages within 1-2 business days.  For prescription refills, please ask your pharmacy to contact our office. Our fax number is [phone].  If you have an urgent issue when the clinic is closed that cannot wait until the next business day, you can page your doctor at the number below.    Please note that while we do our best to be available for urgent issues outside of office hours, we are not available 24/7.   If you have an urgent issue and are unable to reach [place], you may choose to seek medical care at your doctor's office, retail clinic, urgent care center, or emergency room.  If you have a medical emergency, please  immediately call 911 or go to the emergency department.  Pager Numbers  - Dr. Gwen Pounds: [phone]  - Dr. Neale Burly: [phone]  - Dr. Roseanne Reno: [phone]  In the event of inclement weather, please call our main line at [phone] for an update on the status of any delays or closures.  Dermatology Medication Tips: Please keep the boxes that topical medications come in in order to help keep track of the instructions about where and how to use these. Pharmacies typically print the medication instructions only on the boxes and not directly on the medication tubes.   If your medication is too expensive, please contact our office at [phone] option 4 or send [place] a message through MyChart.   We are unable to tell what your co-pay for medications will be in advance as this is different depending on your insurance coverage. However, we may be able to find a substitute medication at lower cost or fill out paperwork to get insurance to cover a needed medication.   If a prior authorization is required to get your medication covered by your insurance company, please allow [place] 1-2 business days to complete this process.  Drug prices often vary depending on where the prescription is filled and some pharmacies may offer cheaper prices.  The website www.goodrx.com contains coupons for medications through different pharmacies. The prices here do not account for what the cost may be with help from insurance (it may be cheaper with your insurance), but the website can give you the price if you did not use any insurance.  - You can print the associated coupon and take it with your prescription to the pharmacy.  - You may also stop by our office during regular business hours and pick up a GoodRx coupon card.  - If you need your prescription sent electronically to a different pharmacy, notify our office through [facility] or by phone at [phone] option 4.     Si Usted Necesita Algo Despus  de Su Visita  Tambin puede enviarnos un mensaje a travs de Clinical cytogeneticist. Por lo general respondemos a los mensajes de MyChart en el transcurso de 1 a 2 das hbiles.  Para renovar recetas, por favor pida a su farmacia que se ponga en contacto con nuestra oficina. Annie Sable de fax es [place] [phone].  Si tiene un asunto urgente cuando la clnica est cerrada y que no puede esperar hasta el siguiente da hbil, puede llamar/localizar a su doctor(a) al nmero que aparece a  continuacin.   Por favor, tenga en cuenta que aunque hacemos todo lo posible para estar disponibles para asuntos urgentes fuera del horario de [place], no estamos disponibles las 24 horas del da, los 7 [address]  [address] de la [place].   Si tiene un problema urgente y no puede comunicarse con nosotros, puede optar por buscar atencin mdica  en el consultorio de su doctor(a), en una clnica privada, en un centro de atencin urgente o en una sala de emergencias.  Si tiene Engineer, drilling, por favor llame inmediatamente al 911 o vaya a la sala de emergencias.  Nmeros de bper  - Dr. Gwen Pounds: [phone]  - Dra. Zarek Relph: [phone]  - Dra. Roseanne Reno: [phone]  En caso de inclemencias del [place], por favor llame a Lacy Duverney principal al [phone] para una actualizacin sobre el [place] de cualquier retraso o cierre.  Consejos para la medicacin en dermatologa: Por favor, guarde las cajas en las que vienen los medicamentos de uso tpico para ayudarle a seguir las instrucciones sobre dnde y cmo usarlos. Las farmacias generalmente imprimen las instrucciones del medicamento slo en las cajas y no directamente en los tubos del [place].   Si su medicamento es muy caro, por favor, pngase en contacto con Rolm Gala llamando al [phone] y presione la opcin 4 o envenos un mensaje a travs de Clinical cytogeneticist.   No podemos decirle cul ser su copago por los medicamentos por adelantado ya que esto es diferente dependiendo  de la cobertura de su seguro. Sin embargo, es posible que podamos encontrar un medicamento sustituto a Audiological scientist un formulario para que el seguro cubra el medicamento que se considera necesario.   Si se requiere una autorizacin previa para que su compaa de seguros [place] su medicamento, por favor permtanos de 1 a 2 das hbiles para completar [address]  Los precios de los medicamentos varan con frecuencia dependiendo del Environmental consultant de dnde se surte la receta y alguna farmacias pueden ofrecer precios ms baratos.  El sitio web www.goodrx.com tiene cupones para medicamentos de Health and safety inspector. Los precios aqu no tienen en cuenta lo que podra costar con la ayuda del seguro (puede ser ms barato con su seguro), pero el sitio web puede darle el precio si no utiliz Tourist information centre manager.  - Puede imprimir el cupn correspondiente y llevarlo con su receta a la farmacia.  - Tambin puede pasar por nuestra oficina durante el horario de atencin regular y Education officer, museum una tarjeta de cupones de GoodRx.  - Si necesita que su receta se enve electrnicamente a una farmacia diferente, informe a nuestra oficina a travs de MyChart de [place] o por telfono llamando al [phone] y presione la opcin 4.

## 2022-06-24 NOTE — Progress Notes (Unsigned)
Follow-Up Visit   Subjective  Carl Chen is a 82 y.o. male who presents for the following: Skin Cancer Screening and Full Body Skin Exam 3 month tbse and ak follow up Hx of bcc, hx of scc, hx of sccis, hx of aks  The patient presents for Total-Body Skin Exam (TBSE) for skin cancer screening and mole check. The patient has spots, moles and lesions to be evaluated, some may be new or changing and the patient has concerns that these could be cancer.    The following portions of the chart were reviewed this encounter and updated as appropriate: medications, allergies, medical history  Review of Systems:  No other skin or systemic complaints except as noted in HPI or Assessment and Plan.  Objective  Well appearing patient in no apparent distress; mood and affect are within normal limits.  A full examination was performed including scalp, head, eyes, ears, nose, lips, neck, chest, axillae, abdomen, back, buttocks, bilateral upper extremities, bilateral lower extremities, hands, feet, fingers, toes, fingernails, and toenails. All findings within normal limits unless otherwise noted below.   Relevant physical exam findings are noted in the Assessment and Plan.  left lateral chest x 1, left ear x 3, right preauricular x 1, right tragus x 1, left pretibia x 3 (9) Erythematous thin papules/macules with gritty scale.     Assessment & Plan   Grovers Disease   Exam: excoriated slightly scaly papules scattered at trunk  Treatment Plan:  Use tmc 1 % ointment twice daily for 2 weeks Avoid applying to face, groin, and axilla. Use as directed. Long-term use can cause thinning of the skin.  Topical steroids (such as triamcinolone, fluocinolone, fluocinonide, mometasone, clobetasol, halobetasol, betamethasone, hydrocortisone) can cause thinning and lightening of the skin if they are used for too long in the same area. Your physician has selected the right strength medicine for your  problem and area affected on the body. Please use your medication only as directed by your physician to prevent side effects.   TINEA PEDIS Exam: Scaling and maceration web spaces and over distal and lateral soles.  Treatment Plan: Apply lamisil (terbinafine) cream twice a day until rash is clear and for one week after.  Could consider nicotinamide discussed with primary  Could affect blood sugar so would monitor   LENTIGINES, SEBORRHEIC KERATOSES, HEMANGIOMAS - Benign normal skin lesions - Benign-appearing - Call for any changes  MELANOCYTIC NEVI - Tan-brown and/or pink-flesh-colored symmetric macules and papules - Benign appearing on exam today - Observation - Call clinic for new or changing moles - Recommend daily use of broad spectrum spf 30+ sunscreen to sun-exposed areas.   ACTINIC DAMAGE WITH PRECANCEROUS ACTINIC KERATOSES Counseling for Topical Chemotherapy Management: Patient exhibits: - Severe, confluent actinic changes with pre-cancerous actinic keratoses that is secondary to cumulative UV radiation exposure over time - Condition that is severe; chronic, not at goal. - diffuse scaly erythematous macules and papules with underlying dyspigmentation - Discussed Prescription "Field Treatment" topical Chemotherapy for Severe, Chronic Confluent Actinic Changes with Pre-Cancerous Actinic Keratoses Field treatment involves treatment of an entire area of skin that has confluent Actinic Changes (Sun/ Ultraviolet light damage) and PreCancerous Actinic Keratoses by method of PhotoDynamic Therapy (PDT) and/or prescription Topical Chemotherapy agents such as 5-fluorouracil, 5-fluorouracil/calcipotriene, and/or imiquimod.  The purpose is to decrease the number of clinically evident and subclinical PreCancerous lesions to prevent progression to development of skin cancer by chemically destroying early precancer changes that may or may not be  visible.  It has been shown to reduce the risk of  developing skin cancer in the treated area. As a result of treatment, redness, scaling, crusting, and open sores may occur during treatment course. One or more than one of these methods may be used and may have to be used several times to control, suppress and eliminate the PreCancerous changes. Discussed treatment course, expected reaction, and possible side effects. - Recommend daily broad spectrum sunscreen SPF 30+ to sun-exposed areas, reapply every 2 hours as needed.  - Staying in the shade or wearing long sleeves, sun glasses (UVA+UVB protection) and wide brim hats (4-inch brim around the entire circumference of the hat) are also recommended. - Call for new or changing lesions.  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  - Call if any new or changing lesions are noted between office visits  SKIN CANCER SCREENING PERFORMED TODAY.    Actinic keratosis (9) left lateral chest x 1, left ear x 3, right  preauricular x 1, right tragus x 1, left pretibia x 3     Actinic keratoses are precancerous spots that appear secondary to cumulative UV radiation exposure/sun exposure over time. They are chronic with expected duration over 1 year. A portion of actinic keratoses will progress to squamous cell carcinoma of the skin. It is not possible to reliably predict which spots will progress to skin cancer and so treatment is recommended to prevent development of skin cancer.  Recommend daily broad spectrum sunscreen SPF 30+ to sun-exposed areas, reapply every 2 hours as needed.  Recommend staying in the shade or wearing long sleeves, sun glasses (UVA+UVB protection) and wide brim hats (4-inch brim around the entire circumference of the hat). Call for new or changing lesions.  Destruction of lesion - left lateral chest x 1, left ear x 3, right preauricular x 1, right tragus x 1, left pretibia x 3  Destruction method: cryotherapy   Informed consent: discussed and consent obtained   Lesion destroyed using liquid nitrogen: Yes   Cryotherapy cycles:  2 Outcome: patient tolerated procedure well with no complications   Post-procedure details: wound care instructions given   Additional details:  Prior to procedure, discussed risks of blister formation, small wound, skin dyspigmentation, or rare scar following cryotherapy. Recommend Vaseline ointment to treated areas while healing.   Dermatitis  Related Medications triamcinolone ointment (KENALOG) 0.1 % Apply twice daily to affected areas as needed for rash. Use up to 2 weeks. Avoid applying to  face, groin, and axilla.   Return for 6 month tbse ak follow with Dr. Katrinka Blazing .  I, Asher Muir, CMA, am acting as scribe for Darden Dates, MD.   Documentation: I have reviewed the above documentation for accuracy and completeness, and I agree with the above.  Darden Dates, MD

## 2022-07-10 ENCOUNTER — Encounter: Payer: Medicare Other | Admitting: Dermatology

## 2022-09-24 DIAGNOSIS — T82198A Other mechanical complication of other cardiac electronic device, initial encounter: Secondary | ICD-10-CM | POA: Insufficient documentation

## 2022-12-17 ENCOUNTER — Ambulatory Visit: Payer: Medicare Other | Admitting: Urology

## 2022-12-23 ENCOUNTER — Ambulatory Visit: Payer: Medicare Other | Admitting: Dermatology

## 2023-01-22 ENCOUNTER — Ambulatory Visit: Payer: Medicare Other | Admitting: Urology

## 2023-01-22 VITALS — BP 159/90 | HR 69 | Ht 71.0 in | Wt 161.1 lb

## 2023-01-22 DIAGNOSIS — N401 Enlarged prostate with lower urinary tract symptoms: Secondary | ICD-10-CM | POA: Diagnosis not present

## 2023-01-22 DIAGNOSIS — R339 Retention of urine, unspecified: Secondary | ICD-10-CM | POA: Diagnosis not present

## 2023-01-22 DIAGNOSIS — Z87898 Personal history of other specified conditions: Secondary | ICD-10-CM | POA: Diagnosis not present

## 2023-01-22 LAB — URINALYSIS, COMPLETE
Bilirubin, UA: NEGATIVE
Ketones, UA: NEGATIVE
Nitrite, UA: NEGATIVE
Protein,UA: NEGATIVE
RBC, UA: NEGATIVE
Specific Gravity, UA: 1.01 (ref 1.005–1.030)
Urobilinogen, Ur: 0.2 mg/dL (ref 0.2–1.0)
pH, UA: 6 (ref 5.0–7.5)

## 2023-01-22 LAB — MICROSCOPIC EXAMINATION

## 2023-01-22 LAB — BLADDER SCAN AMB NON-IMAGING: Scan Result: 265

## 2023-01-22 NOTE — Progress Notes (Signed)
 01/22/2023 9:17 AM   Carl Chen Jul 02, 1940 [id number]  Referring provider: Valora Agent, MD [address] [facility] [place],  [place] [address]  Chief Complaint  Patient presents with   Establish Care   Benign Prostatic Hypertrophy    HPI: 83 year old male who presents today to establish care.  He has performed a followed by Dr. Gala.  PVR today is elevated, 265 cc.  Most recent PSA check by Dr. Gala was 3.7 in 1 of 2023.  Notably, he does have a personal history of elevated PSA which is elevated until his PVP and then has been in the normal range since.  He has a personal history of BPH status post prostate laser vaporization by Dr. Gala in 2014.  He underwent a hematuria workup in 2021 including cystoscopy.  This showed a bladder diverticulum but no other GU pathology.  He denies any urinary issues.  He is overall pleased.  He gets up once or twice at night to urinate but if he avoids fluids before hand, will he does not have this issue.  No further episodes of gross hematuria.  Urinalysis today shows a few WBCs otherwise negative.  No history of recurrent UTIs.  Baseline creatinine 0.7 most recently on 01/19/2023.   IPSS     Row Name 01/22/23 0900         International Prostate Symptom Score   How often have you had the sensation of not emptying your bladder? Not at All     How often have you had to urinate less than every two hours? Not at All     How often have you found you stopped and started again several times when you urinated? Not at All     How often have you found it difficult to postpone urination? Not at All     How often have you had a weak urinary stream? Not at All     How often have you had to strain to start urination? Not at All     How many times did you typically get up at night to urinate? 2 Times     Total IPSS Score 2       Quality of Life due to urinary symptoms   If you were to spend the rest of your life with your  urinary condition just the way it is now how would you feel about that? 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 [facility] 10/01/2021    Surgical History: Past Surgical History:  Procedure Laterality Date   PACEMAKER PLACEMENT     SKIN CANCER EXCISION      Home Medications:  Allergies as of 01/22/2023       Reactions   Penicillins Other (See Comments)   stomach        Medication List        Accurate as of January 22, 2023  9:17 AM. If you have any questions, ask your nurse or doctor.          STOP taking these medications    Cholecalciferol 25  MCG (1000 UT) tablet   ciclopirox  0.77 % cream Commonly known as: LOPROX    Cinnamon 500 MG capsule   clindamycin  1 % gel Commonly known as: CLINDAGEL   Eliquis 5 MG Tabs tablet Generic drug: apixaban   magnesium oxide 400 MG tablet Commonly known as: MAG-OX   nystatin -triamcinolone  ointment Commonly known as: MYCOLOG   triamcinolone  ointment 0.1 % Commonly known as: KENALOG        TAKE these medications    acetaminophen 500 MG tablet Commonly known as: TYLENOL Take 1,000 mg by mouth every 8 (eight) hours as needed.   amLODipine 5 MG tablet Commonly known as: NORVASC Take 5 mg by mouth daily.   Entresto 49-51 MG Generic drug: sacubitril-valsartan Take 1 tablet by mouth 2 (two) times daily.   eplerenone 25 MG tablet Commonly known as: INSPRA Take 1 tablet by mouth daily.   glipiZIDE 5 MG 24 hr tablet Commonly known as: GLUCOTROL XL Take 10 mg by mouth daily with breakfast.   HumuLIN 70/30 KwikPen (70-30) 100 UNIT/ML KwikPen Generic drug: insulin isophane & regular human KwikPen Inject  into the skin.  Inject 10-12 Units subcutaneously 2 (two) times daily before meals What changed: Another medication with the same name was removed. Continue taking this medication, and follow the directions you see here.   Investigational - Study Medication Take 35 mg by mouth daily. Take o each by mouth Daily before dinner 35 mg empaglifozin and/or35 mg placebo. Study is being conducted by [employer] ,[place]   metFORMIN 1000 MG tablet Commonly known as: GLUCOPHAGE Take 1,000 mg by mouth 2 (two) times daily with a meal.   metoprolol succinate 25 MG 24 hr tablet Commonly known as: TOPROL-XL Take 25 mg by mouth daily.   simvastatin 20 MG tablet Commonly known as: ZOCOR Take 20 mg by mouth daily.   vitamin B-12 500 MCG tablet Commonly known as: CYANOCOBALAMIN Take 500 mcg by mouth daily.        Allergies:  Allergies  Allergen Reactions   Penicillins Other (See Comments)    stomach    Family History: Family History  Problem Relation Age of Onset   Arthritis Other        RA    Social History:  reports that he has quit smoking. He has a 180 pack-year smoking history. His smokeless tobacco use includes chew. He reports that he does not drink alcohol. No history on file for drug use.   Physical Exam: BP (!) 159/90   Pulse 69   Ht 5' 11 (1.803 m)   Wt 161 lb 2 oz (73.1 kg)   BMI 22.47 kg/m   Constitutional:  Alert and oriented, No acute distress. HEENT: [place] AT, moist mucus membranes.  Trachea midline, no masses. Neurologic: Grossly intact, no focal deficits, moving all 4 extremities. Psychiatric: Normal mood and affect.   Pertinent Imaging: Results for orders placed or performed in visit on 01/22/23  Bladder Scan (Post Void Residual) in office   Collection Time: 01/22/23  9:02 AM  Result Value Ref Range   Scan Result 265 ml      Assessment & Plan:    1. Benign prostatic hyperplasia with incomplete bladder emptying (Primary) Personal history of  prostamegaly status post remote history of PVP.  Incomplete bladder emptying noted today on bladder scan however the patient is asymptomatic, has no sequela including normal creatinine and no recurrent infections.  As such, would not recommend any intervention at this time.  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  Agree with conservative management at this time, will continue to monitor PVR.  Discussed PSA screening guidelines, based on his age and comorbidities, would not recommend any further PSA screening.  He is agreeable this plan. - Urinalysis, Complete - Bladder Scan (Post Void Residual) in office  2. History of gross hematuria Urinalysis today is reassuring, no further episodes.  F/u 1 year with PA for IPSS/ PVR/ UA  Rosina Riis, MD  [facility] [address] [place], [place] [address] [phone]

## 2023-01-27 ENCOUNTER — Ambulatory Visit: Payer: Self-pay | Admitting: Urology

## 2023-02-04 ENCOUNTER — Ambulatory Visit: Payer: Medicare Other | Admitting: Urology

## 2023-02-06 ENCOUNTER — Ambulatory Visit: Payer: Medicare Other | Admitting: Urology

## 2023-02-06 VITALS — BP 153/82 | HR 75 | Ht 71.0 in | Wt 161.0 lb

## 2023-02-06 DIAGNOSIS — N401 Enlarged prostate with lower urinary tract symptoms: Secondary | ICD-10-CM | POA: Diagnosis not present

## 2023-02-06 DIAGNOSIS — R3914 Feeling of incomplete bladder emptying: Secondary | ICD-10-CM | POA: Diagnosis not present

## 2023-02-06 DIAGNOSIS — R310 Gross hematuria: Secondary | ICD-10-CM

## 2023-02-06 DIAGNOSIS — M545 Low back pain, unspecified: Secondary | ICD-10-CM | POA: Insufficient documentation

## 2023-02-06 LAB — URINALYSIS, COMPLETE
Bilirubin, UA: NEGATIVE
Ketones, UA: NEGATIVE
Leukocytes,UA: NEGATIVE
Nitrite, UA: NEGATIVE
Specific Gravity, UA: 1.02 (ref 1.005–1.030)
Urobilinogen, Ur: 0.2 mg/dL (ref 0.2–1.0)
pH, UA: 6 (ref 5.0–7.5)

## 2023-02-06 LAB — MICROSCOPIC EXAMINATION: RBC, Urine: >30 /[HPF] — AB (ref 0–2)

## 2023-02-06 LAB — BLADDER SCAN AMB NON-IMAGING: Scan Result: 205

## 2023-02-06 NOTE — Progress Notes (Unsigned)
02/06/2023 11:32 PM   Carl Chen 12/14/1940 [id number]  Referring provider: Jerl Mina, MD [address] [place],  [place] [address]  Urological history: 1. BPH with LU TS -PSA (2023) 3.7 -PVP (2014)   2.  High risk hematuria -former smoker -Hematuria workup by Dr. Sheppard Penton (2021) bladder diverticulum    Chief Complaint  Patient presents with   Hematuria    HPI: Carl Chen is a 83 y.o. male who presents today for gross heme.   Previous records reviewed.   He states this morning he woke up and when he voided he saw blood in his urine.  It has been clearing up throughout the day.  He states the same thing happened to him in August.  Patient denies any modifying or aggravating factors.  Patient denies any recent UTI's, dysuria or suprapubic/flank pain.  Patient denies any fevers, chills, nausea or vomiting.    PVR 205 mL   UA yellow slightly cloudy, 2+ glucose, specific gravity 1.020, 3+ blood, pH 6.0, trace protein, 0-5 WBCs, greater than 30 RBCs, 0-10 epithelial cells and a few bacteria.  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 [facility] 10/01/2021    Surgical History: Past Surgical History:  Procedure Laterality Date   PACEMAKER PLACEMENT     SKIN CANCER EXCISION      Home Medications:  Allergies as of 02/06/2023       Reactions   Penicillins Other (See Comments)   stomach        Medication List        Accurate as of February 06, 2023 11:59 PM. If you have any questions, ask your nurse or doctor.          STOP taking these medications    amLODipine 5 MG tablet Commonly known as: NORVASC   Investigational - Study Medication       TAKE these medications     acetaminophen 500 MG tablet Commonly known as: TYLENOL Take 1,000 mg by mouth every 8 (eight) hours as needed.   Entresto 49-51 MG Generic drug: sacubitril-valsartan Take 1 tablet by mouth 2 (two) times daily.   eplerenone 25 MG tablet Commonly known as: INSPRA Take 1 tablet by mouth daily.   glipiZIDE 5 MG 24 hr tablet Commonly known as: GLUCOTROL XL Take 10 mg by mouth daily with breakfast.   HumuLIN 70/30 KwikPen (70-30) 100 UNIT/ML KwikPen Generic drug: insulin isophane & regular human KwikPen Inject into the skin.  Inject 10-12 Units subcutaneously 2 (two) times daily before meals   metFORMIN 1000 MG tablet Commonly known as: GLUCOPHAGE Take 1,000 mg by mouth 2 (two) times daily with a meal.   metoprolol succinate 25 MG 24 hr tablet Commonly known as: TOPROL-XL Take 25 mg by mouth daily.   simvastatin 20 MG tablet Commonly known as: ZOCOR Take 20 mg by mouth daily.   vitamin B-12 500 MCG tablet Commonly known as: CYANOCOBALAMIN Take 500 mcg by mouth daily.        Allergies:  Allergies  Allergen Reactions   Penicillins Other (See Comments)    stomach    Family History: Family History  Problem Relation Age of Onset   Arthritis Other  RA    Social History:  reports that he has quit smoking. He has a 180 pack-year smoking history. His smokeless tobacco use includes chew. He reports that he does not drink alcohol. No history on file for drug use.  ROS: Pertinent ROS in HPI  Physical Exam: BP (!) 153/82   Pulse 75   Ht 5\' 11"  (1.803 m)   Wt 161 lb (73 kg)   BMI 22.45 kg/m   Constitutional:  Well nourished. Alert and oriented, No acute distress. HEENT: [place] AT, moist mucus membranes.  Trachea midline, no masses. Cardiovascular: No clubbing, cyanosis, or edema. Respiratory: Normal respiratory effort, no increased work of breathing. Neurologic: Grossly intact, no focal deficits, moving all 4 extremities. Psychiatric: Normal mood and  affect.  Laboratory Data: Hemoglobin A1C Order: [id number] Component Ref Range & Units 2 wk ago  Hemoglobin A1C 4.2 - 5.6 % 7.3 High   Average Blood Glucose (Calc) mg/dL 409  Resulting Agency [facility]  Narrative Performed by [facility] Normal Range:    4.2 - 5.6% Increased Risk:  5.7 - 6.4% Diabetes:        >= 6.5% Glycemic Control for adults with diabetes:  <7%    Specimen Collected: 01/19/23 08:38   Performed by: [facility] Last Resulted: 01/19/23 10:47  Received From: [facility]  Result Received: 01/20/23 15:23   Basic Metabolic Panel (BMP) Order: [id number] Component Ref Range & Units 2 wk ago  Glucose 70 - 110 mg/dL 956 High   Sodium 213 - 145 mmol/L 139  Potassium 3.6 - 5.1 mmol/L 4.6  Chloride 97 - 109 mmol/L 103  Carbon Dioxide (CO2) 22.0 - 32.0 mmol/L 27.9  Calcium 8.7 - 10.3 mg/dL 9.4  Urea Nitrogen (BUN) 7 - 25 mg/dL 11  Creatinine 0.7 - 1.3 mg/dL 0.7  Glomerular Filtration Rate (eGFR) >60 mL/min/1.73sq m 93  Comment: CKD-EPI (2021) does not include patient's race in the calculation of eGFR.  Monitoring changes of plasma creatinine and eGFR over time is useful for monitoring kidney function.  Interpretive Ranges for eGFR (CKD-EPI 2021):  eGFR:       >60 mL/min/1.73 sq. m - Normal eGFR:       30-59 mL/min/1.73 sq. m - Moderately Decreased eGFR:       15-29 mL/min/1.73 sq. m  - Severely Decreased eGFR:       < 15 mL/min/1.73 sq. m  - Kidney Failure   Note: These eGFR calculations do not apply in acute situations when eGFR is changing rapidly or patients on dialysis.  BUN/Crea Ratio 6.0 - 20.0 15.7  Anion Gap w/K 6.0 - 16.0 12.7  Resulting Agency [facility]   Specimen Collected: 01/19/23 08:38   Performed by: [facility] Last Resulted: 01/19/23 10:38  Received From: [facility]  Result Received: 01/20/23 15:23    Urinalysis See  EPIC and HPI  I have reviewed the labs.   Pertinent Imaging:  02/06/23 09:30  Scan Result 205 ml    Assessment & Plan:    1. Gross hematuria (Primary) - Urinalysis, Complete - CULTURE, URINE COMPREHENSIVE, if urine culture is positive we will treat with antibiotics and then reassess, if urine culture is negative then: - we discussed that there are a number of causes that can be associated with blood in the urine, such as stones,  BPH, UTI's, damage to the urinary tract and/or cancer.  Sometimes, we do not find a cause or source of the hematuria.   -we discussed that new guidelines place individuals into risk categories of low, intermediate and high risk categories.  These factors are based on age, smoking history and degree of blood in urine.   -we discussed that at this time, they are in the high risk stratification  -we discussed that the recommended protocol for further work up are are CT urogram and cysto  -we discussed that for a CT urogram a contrast material will be injected into a vein and that in rare instances, an allergic reaction can result and may even life threatening (1:100,000)  The patient denies any allergies to contrast, iodine and/or seafood and is taking metformin. - we discussed that following the imaging study,  a cystoscopy is performed  -we discussed that a cystoscopy is a procedure that consists of passing a camera up their urethra after administering lidocaine to anesthetize and that after the procedure a minor amount of blood in the urine and/or burning which usually resolves in 24 to 48 hours may occur  -the patient had the opportunity to ask questions which were answered. Based upon this discussion, the patient is willing to proceed. Therefore, I will order a CT Urogram and cystoscopy if urine culture is negative  - UA  - Urine culture   2. Benign prostatic hyperplasia with incomplete bladder emptying - Bladder Scan (Post Void Residual) in office  -stable   Return for pending urine culture results .  These notes generated with voice recognition software. I apologize for typographical errors.  Cloretta Ned  [facility] [address]  [address] [place], [place] [address] [phone]

## 2023-02-08 ENCOUNTER — Encounter: Payer: Self-pay | Admitting: Urology

## 2023-02-10 ENCOUNTER — Other Ambulatory Visit: Payer: Self-pay | Admitting: Urology

## 2023-02-10 DIAGNOSIS — R310 Gross hematuria: Secondary | ICD-10-CM

## 2023-02-10 LAB — CULTURE, URINE COMPREHENSIVE

## 2023-02-20 ENCOUNTER — Ambulatory Visit
Admission: RE | Admit: 2023-02-20 | Discharge: 2023-02-20 | Disposition: A | Discharge status: Home / Self Care | Payer: Medicare Other | Source: Ambulatory Visit | Attending: Urology

## 2023-02-20 DIAGNOSIS — R310 Gross hematuria: Secondary | ICD-10-CM | POA: Insufficient documentation

## 2023-02-20 MED ORDER — IOHEXOL 300 MG/ML  SOLN
100.0000 mL | Freq: Once | INTRAMUSCULAR | Status: AC | PRN
  Administered 2023-02-20: 100 mL via INTRAVENOUS

## 2023-03-11 ENCOUNTER — Ambulatory Visit: Payer: Medicare Other | Admitting: Urology

## 2023-03-11 ENCOUNTER — Encounter: Payer: Self-pay | Admitting: Urology

## 2023-03-11 VITALS — BP 152/75 | HR 77 | Ht 71.0 in | Wt 163.6 lb

## 2023-03-11 DIAGNOSIS — R310 Gross hematuria: Secondary | ICD-10-CM

## 2023-03-11 DIAGNOSIS — N401 Enlarged prostate with lower urinary tract symptoms: Secondary | ICD-10-CM

## 2023-03-11 LAB — URINALYSIS, COMPLETE
Bilirubin, UA: NEGATIVE
Glucose, UA: NEGATIVE
Ketones, UA: NEGATIVE
Nitrite, UA: NEGATIVE
Protein,UA: NEGATIVE
RBC, UA: NEGATIVE
Specific Gravity, UA: 1.02 (ref 1.005–1.030)
Urobilinogen, Ur: 0.2 mg/dL (ref 0.2–1.0)
pH, UA: 6 (ref 5.0–7.5)

## 2023-03-11 LAB — MICROSCOPIC EXAMINATION

## 2023-03-11 NOTE — Progress Notes (Unsigned)
   03/11/23  CC:  Chief Complaint  Patient presents with   Cysto    HPI: 83 year old male with personal history of BPH status post PVP who presents today for cystoscopy in the setting of gross hematuria.  Please see previous notes for details.  In the interim, he underwent CT urogram in 02/20/2023 that shows an enlarged prostate along with median lobe projecting into the bladder base of the right-sided bladder neck diverticulum otherwise no upper tract pathology identified.  Blood pressure (!) 152/75, pulse 77, height 5\' 11"  (1.803 m), weight 163 lb 9.6 oz (74.2 kg). NED. A&Ox3.   No respiratory distress   Abd soft, NT, ND Normal phallus with bilateral descended testicles  Cystoscopy Procedure Note  Patient identification was confirmed, informed consent was obtained, and patient was prepped using Betadine solution.  Lidocaine jelly was administered per urethral meatus.     Pre-Procedure: - Inspection reveals a normal caliber ureteral meatus.  Procedure: The flexible cystoscope was introduced without difficulty - No urethral strictures/lesions are present. - Enlarged prostate irregular fossa, -  Irregular and elevated  bladder neck - Bilateral ureteral orifices identified - Bladder mucosa  reveals no ulcers, tumors, or lesions - No bladder stones -Moderate to severe trabeculation multiple saccules and 1 large diverticulum.  Retroflexion shows fairly unusual nodular regrowth pattern of intravesical prostate including at least 4 discrete nodules but all arising from the prostatic fossa.  Mild bleeding with prostate manipulation.   Post-Procedure: - Patient tolerated the procedure well  Assessment/ Plan:  1. Gross hematuria (Primary) Cystoscopy and CT urogram consistent with nodular prostatic regrowth at the bladder neck consistent with BPH.  There was bleeding with manipulation which was likely the source of his gross hematuria.  Overall, his PSA is relatively normal  for his age and he is minimally symptomatic.  As such, would not recommend any further intervention.  Overall the workup was reassuring. - Urinalysis, Complete  2. Benign prostatic hyperplasia with incomplete bladder emptying Follow-up annually as previously scheduled   Vanna Scotland, MD

## 2023-04-23 ENCOUNTER — Encounter: Payer: Self-pay | Admitting: Dermatology

## 2023-04-23 ENCOUNTER — Ambulatory Visit: Admitting: Dermatology

## 2023-04-23 DIAGNOSIS — L814 Other melanin hyperpigmentation: Secondary | ICD-10-CM

## 2023-04-23 DIAGNOSIS — L578 Other skin changes due to chronic exposure to nonionizing radiation: Secondary | ICD-10-CM

## 2023-04-23 DIAGNOSIS — L739 Follicular disorder, unspecified: Secondary | ICD-10-CM | POA: Diagnosis not present

## 2023-04-23 DIAGNOSIS — B351 Tinea unguium: Secondary | ICD-10-CM

## 2023-04-23 DIAGNOSIS — L821 Other seborrheic keratosis: Secondary | ICD-10-CM

## 2023-04-23 DIAGNOSIS — D492 Neoplasm of unspecified behavior of bone, soft tissue, and skin: Secondary | ICD-10-CM | POA: Diagnosis not present

## 2023-04-23 DIAGNOSIS — W908XXA Exposure to other nonionizing radiation, initial encounter: Secondary | ICD-10-CM

## 2023-04-23 DIAGNOSIS — D229 Melanocytic nevi, unspecified: Secondary | ICD-10-CM

## 2023-04-23 DIAGNOSIS — N400 Enlarged prostate without lower urinary tract symptoms: Secondary | ICD-10-CM | POA: Insufficient documentation

## 2023-04-23 DIAGNOSIS — G8929 Other chronic pain: Secondary | ICD-10-CM | POA: Insufficient documentation

## 2023-04-23 DIAGNOSIS — D045 Carcinoma in situ of skin of trunk: Secondary | ICD-10-CM

## 2023-04-23 DIAGNOSIS — E119 Type 2 diabetes mellitus without complications: Secondary | ICD-10-CM | POA: Insufficient documentation

## 2023-04-23 DIAGNOSIS — B353 Tinea pedis: Secondary | ICD-10-CM

## 2023-04-23 DIAGNOSIS — Z1283 Encounter for screening for malignant neoplasm of skin: Secondary | ICD-10-CM | POA: Diagnosis not present

## 2023-04-23 DIAGNOSIS — L570 Actinic keratosis: Secondary | ICD-10-CM | POA: Diagnosis not present

## 2023-04-23 DIAGNOSIS — D1801 Hemangioma of skin and subcutaneous tissue: Secondary | ICD-10-CM

## 2023-04-23 DIAGNOSIS — T148XXA Other injury of unspecified body region, initial encounter: Secondary | ICD-10-CM

## 2023-04-23 NOTE — Patient Instructions (Addendum)
 Folliculitis on abdomen, scalp and neck Start Hibiclens wash to abdomen, scalp and neck daily for bumps, Hibiclens is over the counter, avoid getting in eyes    Wound Care Instructions  Cleanse wound gently with soap and water once a day then pat dry with clean gauze. Apply a thin coat of Petrolatum (petroleum jelly, "Vaseline") over the wound (unless you have an allergy to this). We recommend that you use a new, sterile tube of Vaseline. Do not pick or remove scabs. Do not remove the yellow or white "healing tissue" from the base of the wound.  Cover the wound with fresh, clean, nonstick gauze and secure with paper tape. You may use Band-Aids in place of gauze and tape if the wound is small enough, but would recommend trimming much of the tape off as there is often too much. Sometimes Band-Aids can irritate the skin.  You should call the office for your biopsy report after 1 week if you have not already been contacted.  If you experience any problems, such as abnormal amounts of bleeding, swelling, significant bruising, significant pain, or evidence of infection, please call the office immediately.  FOR ADULT SURGERY PATIENTS: If you need something for pain relief you may take 1 extra strength Tylenol (acetaminophen) AND 2 Ibuprofen (200mg  each) together every 4 hours as needed for pain. (do not take these if you are allergic to them or if you have a reason you should not take them.) Typically, you may only need pain medication for 1 to 3 days.      Cryotherapy Aftercare  Wash gently with soap and water everyday.   Apply Vaseline and Band-Aid daily until healed.   Due to recent changes in healthcare laws, you may see results of your pathology and/or laboratory studies on MyChart before the doctors have had a chance to review them. We understand that in some cases there may be results that are confusing or concerning to you. Please understand that not all results are received at the same  time and often the doctors may need to interpret multiple results in order to provide you with the best plan of care or course of treatment. Therefore, we ask that you please give [place] 2 business days to thoroughly review all your results before contacting the office for clarification. Should we see a critical lab result, you will be contacted sooner.   If You Need Anything After Your Visit  If you have any questions or concerns for your doctor, please call our main line at [phone] and press option 4 to reach your doctor's medical assistant. If no one answers, please leave a voicemail as directed and we will return your call as soon as possible. Messages left after 4 pm will be answered the following business day.   You may also send [place] a message via MyChart. We typically respond to MyChart messages within 1-2 business days.  For prescription refills, please ask your pharmacy to contact our office. Our fax number is [phone].  If you have an urgent issue when the clinic is closed that cannot wait until the next business day, you can page your doctor at the number below.    Please note that while we do our best to be available for urgent issues outside of office hours, we are not available 24/7.   If you have an urgent issue and are unable to reach [place], you may choose to seek medical care at your doctor's office, retail clinic, urgent care center,  or emergency room.  If you have a medical emergency, please immediately call 911 or go to the emergency department.  Pager Numbers  - Dr. Gwen Pounds: [phone]  - Dr. Roseanne Reno: [phone]  - Dr. Katrinka Blazing: [phone]   In the event of inclement weather, please call our main line at [phone] for an update on the status of any delays or closures.  Dermatology Medication Tips: Please keep the boxes that topical medications come in in order to help keep track of the instructions about where and how to use these. Pharmacies typically print  the medication instructions only on the boxes and not directly on the medication tubes.   If your medication is too expensive, please contact our office at [phone] option 4 or send [place] a message through MyChart.   We are unable to tell what your co-pay for medications will be in advance as this is different depending on your insurance coverage. However, we may be able to find a substitute medication at lower cost or fill out paperwork to get insurance to cover a needed medication.   If a prior authorization is required to get your medication covered by your insurance company, please allow [place] 1-2 business days to complete this process.  Drug prices often vary depending on where the prescription is filled and some pharmacies may offer cheaper prices.  The website www.goodrx.com contains coupons for medications through different pharmacies. The prices here do not account for what the cost may be with help from insurance (it may be cheaper with your insurance), but the website can give you the price if you did not use any insurance.  - You can print the associated coupon and take it with your prescription to the pharmacy.  - You may also stop by our office during regular business hours and pick up a GoodRx coupon card.  - If you need your prescription sent electronically to a different pharmacy, notify our office through [facility] or by phone at [phone] option 4.     Si Usted Necesita Algo Despus de Su Visita  Tambin puede enviarnos un mensaje a travs de Clinical cytogeneticist. Por lo general respondemos a los mensajes de MyChart en el transcurso de 1 a 2 das hbiles.  Para renovar recetas, por favor pida a su farmacia que se ponga en contacto con nuestra oficina. Annie Sable de fax es [place] [phone].  Si tiene un asunto urgente cuando la clnica est cerrada y que no puede esperar hasta el siguiente da hbil, puede llamar/localizar a su doctor(a) al nmero que aparece a  continuacin.   Por favor, tenga en cuenta que aunque hacemos todo lo posible para estar disponibles para asuntos urgentes fuera del horario de [place], no estamos disponibles las 24 horas del da, los 7 [address]  [address] de la [place].   Si tiene un problema urgente y no puede comunicarse con nosotros, puede optar por buscar atencin mdica  en el consultorio de su doctor(a), en una clnica privada, en un centro de atencin urgente o en una sala de emergencias.  Si tiene Engineer, drilling, por favor llame inmediatamente al 911 o vaya a la sala de emergencias.  Nmeros de bper  - Dr. Gwen Pounds: [phone]  - Dra. Roseanne Reno: [phone]  - Dr. Katrinka Blazing: [phone]   En caso de inclemencias del tiempo, por favor llame a Lacy Duverney principal al [phone] para una actualizacin sobre el [place] de cualquier retraso o cierre.  Consejos para la medicacin en dermatologa: Por favor, guarde las cajas en  las que vienen los medicamentos de uso tpico para ayudarle a seguir las instrucciones sobre dnde y cmo usarlos. Las farmacias generalmente imprimen las instrucciones del medicamento slo en las cajas y no directamente en los tubos del [place].   Si su medicamento es muy caro, por favor, pngase en contacto con Rolm Gala llamando al [phone] y presione la opcin 4 o envenos un mensaje a travs de Clinical cytogeneticist.   No podemos decirle cul ser su copago por los medicamentos por adelantado ya que esto es diferente dependiendo de la cobertura de su seguro. Sin embargo, es posible que podamos encontrar un medicamento sustituto a Audiological scientist un formulario para que el seguro cubra el medicamento que se considera necesario.   Si se requiere una autorizacin previa para que su compaa de seguros [place] su medicamento, por favor permtanos de 1 a 2 das hbiles para completar [address]  Los precios de los medicamentos varan con frecuencia dependiendo del Environmental consultant de dnde se surte la receta  y alguna farmacias pueden ofrecer precios ms baratos.  El sitio web www.goodrx.com tiene cupones para medicamentos de Health and safety inspector. Los precios aqu no tienen en cuenta lo que podra costar con la ayuda del seguro (puede ser ms barato con su seguro), pero el sitio web puede darle el precio si no utiliz Tourist information centre manager.  - Puede imprimir el cupn correspondiente y llevarlo con su receta a la farmacia.  - Tambin puede pasar por nuestra oficina durante el horario de atencin regular y Education officer, museum una tarjeta de cupones de GoodRx.  - Si necesita que su receta se enve electrnicamente a una farmacia diferente, informe a nuestra oficina a travs de MyChart de  o por telfono llamando al [phone] y presione la opcin 4.

## 2023-04-23 NOTE — Progress Notes (Signed)
 Follow-Up Visit   Subjective  Carl Chen is a 83 y.o. male who presents for the following: Skin Cancer Screening and Full Body Skin Exam, hx of BCC,SCC,SCC IS, Aks, Folliculitis chest, abdomen, pt was using TMC and did not use Clindamycin gel as prescribed  The patient presents for Total-Body Skin Exam (TBSE) for skin cancer screening and mole check. The patient has spots, moles and lesions to be evaluated, some may be new or changing and the patient may have concern these could be cancer.    The following portions of the chart were reviewed this encounter and updated as appropriate: medications, allergies, medical history  Review of Systems:  No other skin or systemic complaints except as noted in HPI or Assessment and Plan.  Objective  Well appearing patient in no apparent distress; mood and affect are within normal limits.  A full examination was performed including scalp, head, eyes, ears, nose, lips, neck, chest, axillae, abdomen, back, buttocks, bilateral upper extremities, bilateral lower extremities, hands, feet, fingers, toes, fingernails, and toenails. All findings within normal limits unless otherwise noted below.   Relevant physical exam findings are noted in the Assessment and Plan.  glabella x 1, R sup ear helix x 1, R cheek x 3, R wrist x 1, R dorsal hand x 2, R forearm x 3 (11) Pink scaly macules L mid back 6.36mm cutaneous horn   Assessment & Plan   SKIN CANCER SCREENING PERFORMED TODAY.  ACTINIC DAMAGE - Chronic condition, secondary to cumulative UV/sun exposure - diffuse scaly erythematous macules with underlying dyspigmentation - Recommend daily broad spectrum sunscreen SPF 30+ to sun-exposed areas, reapply every 2 hours as needed.  - Staying in the shade or wearing long sleeves, sun glasses (UVA+UVB protection) and wide brim hats (4-inch brim around the entire circumference of the hat) are also recommended for sun protection.  - Call for new or  changing lesions.  LENTIGINES, SEBORRHEIC KERATOSES, HEMANGIOMAS - Benign normal skin lesions - Benign-appearing - Call for any changes  MELANOCYTIC NEVI - Tan-brown and/or pink-flesh-colored symmetric macules and papules - Benign appearing on exam today - Observation - Call clinic for new or changing moles - Recommend daily use of broad spectrum spf 30+ sunscreen to sun-exposed areas.   HISTORY OF BASAL CELL CARCINOMA OF THE SKIN - No evidence of recurrence today - Recommend regular full body skin exams - Recommend daily broad spectrum sunscreen SPF 30+ to sun-exposed areas, reapply every 2 hours as needed.  - Call if any new or changing lesions are noted between office visits  - mid forehead  HISTORY OF SQUAMOUS CELL CARCINOMA OF THE SKIN - No evidence of recurrence today - No lymphadenopathy - Recommend regular full body skin exams - Recommend daily broad spectrum sunscreen SPF 30+ to sun-exposed areas, reapply every 2 hours as needed.  - Call if any new or changing lesions are noted between office visits - R temple  HISTORY OF SQUAMOUS CELL CARCINOMA IN SITU OF THE SKIN - No evidence of recurrence today - Recommend regular full body skin exams - Recommend daily broad spectrum sunscreen SPF 30+ to sun-exposed areas, reapply every 2 hours as needed.  - Call if any new or changing lesions are noted between office visits  - L neck  FOLLICULITIS Abdomen, scalp, neck Exam: scattered erythemateous paps and pustules abdomen, scalp, neck  Folliculitis occurs due to inflammation of the superficial hair follicle (pore), resulting in acne-like lesions (pus bumps). It can be infectious (bacterial, fungal)  or noninfectious (shaving, tight clothing, heat/sweat, medications).  Folliculitis can be acute or chronic and recommended treatment depends on the underlying cause of folliculitis.  Treatment Plan: Start Hibiclens wash qd to abdomen, scalp, neck, avoid getting in eyes D/C TMC   Aerobic swab of pustule contents to check for bacterial infection   TINEA PEDIS [place] UNGUIUM Exam: Scaling and maceration bil feet, toenail dystrophy  Treatment Plan: Not bothering patient, observe   EPIDERMAL INCLUSION CYST Central lower back Exam: Subcutaneous nodule at central lower back  Benign-appearing. Exam most consistent with an epidermal inclusion cyst. Discussed that a cyst is a benign growth that can grow over time and sometimes get irritated or inflamed. Recommend observation if it is not bothersome. Discussed option of surgical excision to remove it if it is growing, symptomatic, or other changes noted. Please call for new or changing lesions so they can be evaluated. 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 They are chronic with expected duration over 1 year. A portion of actinic keratoses will progress to squamous cell carcinoma of the skin. It is not possible to reliably predict which spots will progress to skin cancer and so treatment is recommended to prevent development of skin cancer.  Recommend daily broad spectrum sunscreen SPF 30+ to sun-exposed areas, reapply every 2 hours as needed.  Recommend staying in the shade or wearing long sleeves, sun glasses (UVA+UVB protection) and wide brim hats (4-inch brim around the entire circumference of the hat). Call for new or changing lesions. Destruction of lesion - glabella x 1, R sup ear helix x 1, R cheek x 3, R wrist x 1, R dorsal hand x 2, R forearm x 3 (11) Complexity: simple   Destruction method: cryotherapy   Informed consent: discussed and consent obtained   Timeout:  patient name, date of birth, surgical site, and procedure verified Lesion destroyed using liquid nitrogen: Yes   Region frozen until ice ball  extended beyond lesion: Yes   Cryo cycles: 1 or 2. Outcome: patient tolerated procedure well with no complications   Post-procedure details: wound care instructions given   NEOPLASM OF SKIN L mid back Skin / nail biopsy Type of biopsy: tangential   Informed consent: discussed and consent obtained   Timeout: patient name, date of birth, surgical site, and procedure verified   Procedure prep:  Patient was prepped and draped in usual sterile fashion Prep type:  Isopropyl alcohol Anesthesia: the lesion was anesthetized in a standard fashion   Anesthetic:  1% lidocaine w/ epinephrine 1-100,000 buffered w/ 8.4% NaHCO3 Instrument used: DermaBlade   Hemostasis achieved with: pressure and aluminum chloride   Outcome: patient tolerated procedure well   Post-procedure details: sterile dressing applied and wound care instructions given   Dressing type: bandage and bacitracin   Specimen 1 - Surgical pathology Differential Diagnosis: D48.5 R/O SCC  Check Margins: No 6.29mm cutaneous horn FOLLICULITIS   Related Procedures Aerobic culture EXCORIATION   MULTIPLE BENIGN NEVI   LENTIGINES   ACTINIC ELASTOSIS   SEBORRHEIC KERATOSES   CHERRY ANGIOMA   TINEA PEDIS OF BOTH FEET   ONYCHOMYCOSIS   Return in about 1 year (around 04/22/2024) for TBSE, Hx of BCC, Hx of SCC, Hx of SCC IS, Hx of AKs.  I, Lamar Laundry  Hupman, RMA, am acting as scribe for Elie Goody, MD .   Documentation: I have reviewed the above documentation for accuracy and completeness, and I agree with the above.  Elie Goody, MD

## 2023-04-27 LAB — SURGICAL PATHOLOGY

## 2023-04-28 LAB — AEROBIC CULTURE

## 2023-04-29 ENCOUNTER — Telehealth: Payer: Self-pay

## 2023-04-29 NOTE — Telephone Encounter (Addendum)
 Called and discussed results with patient. He verbalized understanding. Patient scheduled for [facility] with Dr. Katrinka Blazing 05/19/2023 at 10:45 am   ----- Message from Willeen Niece sent at 04/28/2023  6:40 PM EDT ----- 1. Skin, L mid back :       SQUAMOUS CELL CARCINOMA IN SITU, BASE INVOLVED   SCCIS skin cancer- needs EDC - please call patient  Also bacterial culture is negative

## 2023-05-19 ENCOUNTER — Ambulatory Visit: Admitting: Dermatology

## 2023-05-19 ENCOUNTER — Encounter: Payer: Self-pay | Admitting: Dermatology

## 2023-05-19 DIAGNOSIS — D099 Carcinoma in situ, unspecified: Secondary | ICD-10-CM

## 2023-05-19 DIAGNOSIS — D045 Carcinoma in situ of skin of trunk: Secondary | ICD-10-CM | POA: Diagnosis not present

## 2023-05-19 NOTE — Patient Instructions (Signed)

## 2023-05-19 NOTE — Progress Notes (Signed)
   Follow-Up Visit   Subjective  Carl Chen is a 83 y.o. male who presents for the following: EDC at left mid back  The patient has spots, moles and lesions to be evaluated, some may be new or changing and the patient may have concern these could be cancer.   The following portions of the chart were reviewed this encounter and updated as appropriate: medications, allergies, medical history  Review of Systems:  No other skin or systemic complaints except as noted in HPI or Assessment and Plan.  Objective  Well appearing patient in no apparent distress; mood and affect are within normal limits.   A focused examination was performed of the following areas: back  Relevant exam findings are noted in the Assessment and Plan.  left mid back Pink bx site  Assessment & Plan     SQUAMOUS CELL CARCINOMA IN SITU left mid back Destruction of lesion Complexity: simple   Destruction method: electrodesiccation and curettage   Informed consent: discussed and consent obtained   Timeout:  patient name, date of birth, surgical site, and procedure verified Procedure prep:  Patient was prepped and draped in usual sterile fashion Prep type:  Isopropyl alcohol Anesthesia: the lesion was anesthetized in a standard fashion   Anesthetic:  1% lidocaine w/ epinephrine 1-100,000 local infiltration Curettage performed in three different directions: Yes   Electrodesiccation performed over the curetted area: Yes   Curettage cycles:  3 Final wound size (cm):  1.4 Hemostasis achieved with:  aluminum chloride and electrodesiccation Outcome: patient tolerated procedure well with no complications   Post-procedure details: sterile dressing applied and wound care instructions given   Dressing type: bandage and petrolatum    Return in about 6 months (around 11/18/2023) for TBSE, HxSCCis, with Dr. Felipe Horton, [facility] and 2-3 months to recheck [facility] EDC site.  Kerstin Peeling, RMA, am acting as scribe for  Harris Liming, MD .   Documentation: I have reviewed the above documentation for accuracy and completeness, and I agree with the above.  Harris Liming, MD

## 2023-07-21 ENCOUNTER — Encounter: Payer: Self-pay | Admitting: Dermatology

## 2023-07-21 ENCOUNTER — Ambulatory Visit: Admitting: Dermatology

## 2023-07-21 DIAGNOSIS — Z86007 Personal history of in-situ neoplasm of skin: Secondary | ICD-10-CM

## 2023-07-21 DIAGNOSIS — L821 Other seborrheic keratosis: Secondary | ICD-10-CM | POA: Diagnosis not present

## 2023-07-21 DIAGNOSIS — L111 Transient acantholytic dermatosis [Grover]: Secondary | ICD-10-CM

## 2023-07-21 NOTE — Progress Notes (Signed)
   Follow-Up Visit   Subjective  Carl Chen is a 83 y.o. male who presents for the following: Recheck SCC IS treatment site L mid back, check spot back, hx of Grovers disease with folliculitis, trunk, pt has triec TMC in past and recently used Hibiclens with no change, no symptoms The patient has spots, moles and lesions to be evaluated, some may be new or changing and the patient may have concern these could be cancer.   The following portions of the chart were reviewed this encounter and updated as appropriate: medications, allergies, medical history  Review of Systems:  No other skin or systemic complaints except as noted in HPI or Assessment and Plan.  Objective  Well appearing patient in no apparent distress; mood and affect are within normal limits.   A focused examination was performed of the following areas: back  Relevant exam findings are noted in the Assessment and Plan.    Assessment & Plan   HISTORY OF SQUAMOUS CELL CARCINOMA IN SITU OF THE SKIN - No evidence of recurrence today - Recommend regular full body skin exams - Recommend daily broad spectrum sunscreen SPF 30+ to sun-exposed areas, reapply every 2 hours as needed.  - Call if any new or changing lesions are noted between office visits  - L mid back [facility] 05/19/23  SEBORRHEIC KERATOSIS back - Stuck-on, waxy, tan-brown papules and/or plaques L back - Benign-appearing - Discussed benign etiology and prognosis. 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 Condition is symptomatic / bothersome to patient. Not to goal.   Treatment Plan: Discussed Clobetasol cr, pt declines Pt can d/c Hibiclens if he feels not helping Benign, Observe   GROVER'S DISEASE   SEBORRHEIC KERATOSES   HISTORY OF SQUAMOUS  CELL CARCINOMA IN SITU (SCCIS)    Return for as scheduled 11/24/2023 for TBSE, Hx of BCC, SCC, SCC IS, AKs.  I, Sonya Hupman, RMA, am acting as scribe for Boneta Sharps, MD .   Documentation: I have reviewed the above documentation for accuracy and completeness, and I agree with the above.  Boneta Sharps, MD

## 2023-07-21 NOTE — Patient Instructions (Signed)

## 2023-09-23 NOTE — Progress Notes (Deleted)
 [facility] [address] [place] [address] Phone: [phone] Subjective:    I'm seeing this patient by the request  of:  Valora Agent, MD  CC: knee pain   YEP:Carl Chen  Carl Chen is a 83 y.o. 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   left mid back - base involved - [facility] 05/19/23   Past Surgical History:  Procedure Laterality Date   PACEMAKER PLACEMENT     SKIN CANCER EXCISION     Social History   Socioeconomic History   Marital status: Divorced    Spouse name: Not on file   Number of children: Not on file   Years of education: Not on file   Highest education level: Not on file  Occupational History   Not on file  Tobacco Use   Smoking status: Former    Current packs/day: 3.00    Average packs/day: 3.0 packs/day for 60.0 years (180.0 ttl pk-yrs)    Types: Cigarettes   Smokeless tobacco: Current    Types: Chew   Tobacco comments:    12/12  Down to three pouches a day.  Vaping Use   Vaping status: Never Used  Substance and Sexual Activity   Alcohol use: No   Drug use: Not on file   Sexual activity: Not on file  Other Topics Concern   Not on file  Social History Narrative   Not on file   Social Drivers of Health   Financial Resource Strain: Patient Declined (07/07/2023)   Received from [facility]   Overall Financial Resource Strain (CARDIA)    Difficulty of  Paying Living Expenses: Patient declined  Food Insecurity: Patient Declined (07/07/2023)   Received from [facility]   Hunger Vital Sign    Within the past 12 months, you worried that your food would run out before you got the money to buy more.: Patient declined    Within the past 12 months, the food you bought just didn't last and you didn't have money to get more.: Patient declined  Transportation Needs: Patient Declined (07/07/2023)   Received from [facility] - Transportation    In the past 12 months, has lack of transportation kept you from medical appointments or from getting medications?: Patient declined    Lack of Transportation (Non-Medical): Patient declined  Physical Activity: Not on file  Stress: Not on file  Social Connections: Not on file   Allergies  Allergen Reactions   Empagliflozin Other (See Comments)    Weight loss  empagliflozin   Penicillins Other (See Comments)    stomach  GI upset  Other reaction(s): Other (See Comments)  stomach  GI upset   Family History  Problem Relation Age of Onset   Arthritis Other        RA    Current Outpatient Medications (Endocrine & Metabolic):  dapagliflozin propanediol (FARXIGA) 10 MG TABS tablet, Take 10 mg by mouth daily.   glipiZIDE (GLUCOTROL XL) 5 MG 24 hr tablet, Take 10 mg by mouth daily with breakfast.   insulin isophane & regular human KwikPen (HUMULIN 70/30 KWIKPEN) (70-30) 100 UNIT/ML KwikPen, Inject into the skin.  Inject 10-12 Units subcutaneously 2 (two) times daily before meals   metFORMIN (GLUCOPHAGE) 1000 MG tablet, Take 1,000 mg by mouth 2 (two) times daily with a meal.  Current Outpatient Medications (Cardiovascular):    eplerenone (INSPRA) 25 MG tablet, Take 1 tablet by mouth daily.   metoprolol succinate (TOPROL-XL) 25 MG 24 hr tablet, Take 25 mg by mouth daily.   sacubitril-valsartan (ENTRESTO) 49-51 MG, Take 1 tablet by mouth 2 (two) times daily.    simvastatin (ZOCOR) 20 MG tablet, Take 20 mg by mouth daily.   Current Outpatient Medications (Analgesics):    acetaminophen (TYLENOL) 500 MG tablet, Take 1,000 mg by mouth every 8 (eight) hours as needed.  Current Outpatient Medications (Hematological):    vitamin B-12 (CYANOCOBALAMIN) 500 MCG tablet, Take 500 mcg by mouth daily.  Current Outpatient Medications (Other):    methocarbamol (ROBAXIN) 500 MG tablet, Take 500 mg by mouth 3 (three) times daily.   Reviewed prior external information including notes and imaging from  primary care provider As well as notes that were available from care everywhere and other healthcare systems.  Past medical history, social, surgical and family history all reviewed in electronic medical record.  No pertanent information unless stated regarding to the chief complaint.   Review of Systems:  No headache, visual changes, nausea, vomiting, diarrhea, constipation, dizziness, abdominal pain, skin rash, fevers, chills, night sweats, weight loss, swollen lymph nodes, body aches, joint swelling, chest pain, shortness of breath, mood changes. POSITIVE muscle aches  Objective  There were no vitals taken for this visit.   General: No apparent distress alert and oriented x3 mood and affect normal, dressed appropriately.  HEENT: Pupils equal, extraocular movements intact  Respiratory: Patient's speak in full sentences and does not appear short of breath  Cardiovascular: No lower extremity edema, non tender, no erythema  Left knee exam shows   Limited muscular skeletal ultrasound was performed and interpreted by Carl Chen, M      Impression and Recommendations:     The above documentation has been reviewed and is accurate and complete Jeanne Terrance M Jibri Schriefer, DO

## 2023-09-24 ENCOUNTER — Ambulatory Visit: Admitting: Family Medicine

## 2023-09-28 ENCOUNTER — Encounter: Payer: Self-pay | Admitting: Family Medicine

## 2023-10-15 ENCOUNTER — Encounter: Payer: Self-pay | Admitting: Dermatology

## 2023-10-15 ENCOUNTER — Ambulatory Visit: Admitting: Dermatology

## 2023-10-15 DIAGNOSIS — B351 Tinea unguium: Secondary | ICD-10-CM | POA: Diagnosis not present

## 2023-10-15 DIAGNOSIS — B354 Tinea corporis: Secondary | ICD-10-CM

## 2023-10-15 DIAGNOSIS — B353 Tinea pedis: Secondary | ICD-10-CM | POA: Diagnosis not present

## 2023-10-15 MED ORDER — TERBINAFINE HCL 1 % EX CREA: 1.0000 | TOPICAL_CREAM | Freq: Two times a day (BID) | CUTANEOUS | 1 refills | Status: AC

## 2023-10-15 MED ORDER — KETOCONAZOLE 2 % EX CREA: 1.0000 | TOPICAL_CREAM | Freq: Two times a day (BID) | CUTANEOUS | 1 refills | Status: AC

## 2023-10-15 NOTE — Progress Notes (Signed)
   Follow-Up Visit   Subjective  Carl Chen is a 83 y.o. male who presents for the following: Rash L lower leg, 90m on and off, no symptoms, otc HC cream, calamine   The following portions of the chart were reviewed this encounter and updated as appropriate: medications, allergies, medical history  Review of Systems:  No other skin or systemic complaints except as noted in HPI or Assessment and Plan.  Objective  Well appearing patient in no apparent distress; mood and affect are within normal limits.   A focused examination was performed of the following areas: Left lower leg  Relevant exam findings are noted in the Assessment and Plan.  L medial ankle    Assessment & Plan   TINEA PEDIS, TINEA UNGUIUM, TINEA CORPORIS Feet, toenails, L medial ankle  Exam: red annular scaly plaques L medial distal lower leg, red desquammating plaques L plantar foot, toenail dystrophy   KOH negative, possibly r/t Hydrocortisone use  Treatment Plan: Strong suspicion for dermatophyte infection. May not be able to cure with topicals given steroid use, but will attempt given side effect profile of systemic treatment Start Ketoconazole  2% bid mix with Lamisil  cr Start otc Lamisil  cream bid mix with Ketoconazole  2% cr    TINEA PEDIS OF LEFT FOOT   TINEA CORPORIS   TINEA UNGUIUM    Return for as scheduled for TBSE hx of BCC, SCC, SCC IS, AKs, recheck Tinea on L med ankle.  I, Grayce Saunas, RMA, am acting as scribe for Boneta Sharps, MD .   Documentation: I have reviewed the above documentation for accuracy and completeness, and I agree with the above.  Boneta Sharps, MD

## 2023-10-15 NOTE — Patient Instructions (Signed)

## 2023-10-19 ENCOUNTER — Encounter: Payer: Self-pay | Admitting: Dermatology

## 2023-11-24 ENCOUNTER — Ambulatory Visit: Admitting: Dermatology

## 2023-12-03 ENCOUNTER — Ambulatory Visit: Admitting: Dermatology

## 2023-12-03 ENCOUNTER — Encounter: Payer: Self-pay | Admitting: Dermatology

## 2023-12-03 DIAGNOSIS — L720 Epidermal cyst: Secondary | ICD-10-CM

## 2023-12-03 DIAGNOSIS — L814 Other melanin hyperpigmentation: Secondary | ICD-10-CM

## 2023-12-03 DIAGNOSIS — D492 Neoplasm of unspecified behavior of bone, soft tissue, and skin: Secondary | ICD-10-CM

## 2023-12-03 DIAGNOSIS — D1801 Hemangioma of skin and subcutaneous tissue: Secondary | ICD-10-CM | POA: Diagnosis not present

## 2023-12-03 DIAGNOSIS — C44321 Squamous cell carcinoma of skin of nose: Secondary | ICD-10-CM | POA: Diagnosis not present

## 2023-12-03 DIAGNOSIS — L570 Actinic keratosis: Secondary | ICD-10-CM

## 2023-12-03 DIAGNOSIS — L578 Other skin changes due to chronic exposure to nonionizing radiation: Secondary | ICD-10-CM | POA: Diagnosis not present

## 2023-12-03 DIAGNOSIS — B351 Tinea unguium: Secondary | ICD-10-CM

## 2023-12-03 DIAGNOSIS — W908XXA Exposure to other nonionizing radiation, initial encounter: Secondary | ICD-10-CM | POA: Diagnosis not present

## 2023-12-03 DIAGNOSIS — D229 Melanocytic nevi, unspecified: Secondary | ICD-10-CM

## 2023-12-03 DIAGNOSIS — Z1283 Encounter for screening for malignant neoplasm of skin: Secondary | ICD-10-CM | POA: Diagnosis not present

## 2023-12-03 DIAGNOSIS — D0439 Carcinoma in situ of skin of other parts of face: Secondary | ICD-10-CM

## 2023-12-03 DIAGNOSIS — L821 Other seborrheic keratosis: Secondary | ICD-10-CM

## 2023-12-03 DIAGNOSIS — D692 Other nonthrombocytopenic purpura: Secondary | ICD-10-CM

## 2023-12-03 DIAGNOSIS — B353 Tinea pedis: Secondary | ICD-10-CM

## 2023-12-03 NOTE — Patient Instructions (Addendum)
 Wound Care Instructions  Cleanse wound gently with soap and water once a day then pat dry with clean gauze. Apply a thin coat of Petrolatum (petroleum jelly, Vaseline) over the wound (unless you have an allergy to this). We recommend that you use a new, sterile tube of Vaseline. Do not pick or remove scabs. Do not remove the yellow or white healing tissue from the base of the wound.  Cover the wound with fresh, clean, nonstick gauze and secure with paper tape. You may use Band-Aids in place of gauze and tape if the wound is small enough, but would recommend trimming much of the tape off as there is often too much. Sometimes Band-Aids can irritate the skin.  You should call the office for your biopsy report after 1 week if you have not already been contacted.  If you experience any problems, such as abnormal amounts of bleeding, swelling, significant bruising, significant pain, or evidence of infection, please call the office immediately.  FOR ADULT SURGERY PATIENTS: If you need something for pain relief you may take 1 extra strength Tylenol (acetaminophen) AND 2 Ibuprofen (200mg  each) together every 4 hours as needed for pain. (do not take these if you are allergic to them or if you have a reason you should not take them.) Typically, you may only need pain medication for 1 to 3 days.    Cryotherapy Aftercare  Wash gently with soap and water everyday.   Apply Vaseline Jelly daily until healed.     Recommend daily broad spectrum sunscreen SPF 30+ to sun-exposed areas, reapply every 2 hours as needed. Call for new or changing lesions.  Staying in the shade or wearing long sleeves, sun glasses (UVA+UVB protection) and wide brim hats (4-inch brim around the entire circumference of the hat) are also recommended for sun protection.      Melanoma ABCDEs  Melanoma is the most dangerous type of skin cancer, and is the leading cause of death from skin disease.  You are more likely to develop  melanoma if you: Have light-colored skin, light-colored eyes, or red or blond hair Spend a lot of time in the sun Tan regularly, either outdoors or in a tanning bed Have had blistering sunburns, especially during childhood Have a close family member who has had a melanoma Have atypical moles or large birthmarks  Early detection of melanoma is key since treatment is typically straightforward and cure rates are extremely high if we catch it early.   The first sign of melanoma is often a change in a mole or a new dark spot.  The ABCDE system is a way of remembering the signs of melanoma.  A for asymmetry:  The two halves do not match. B for border:  The edges of the growth are irregular. C for color:  A mixture of colors are present instead of an even brown color. D for diameter:  Melanomas are usually (but not always) greater than 6mm - the size of a pencil eraser. E for evolution:  The spot keeps changing in size, shape, and color.  Please check your skin once per month between visits. You can use a small mirror in front and a large mirror behind you to keep an eye on the back side or your body.   If you see any new or changing lesions before your next follow-up, please call to schedule a visit.  Please continue daily skin protection including broad spectrum sunscreen SPF 30+ to sun-exposed areas, reapplying every 2  hours as needed when you're outdoors.   Staying in the shade or wearing long sleeves, sun glasses (UVA+UVB protection) and wide brim hats (4-inch brim around the entire circumference of the hat) are also recommended for sun protection.      Due to recent changes in healthcare laws, you may see results of your pathology and/or laboratory studies on MyChart before the doctors have had a chance to review them. We understand that in some cases there may be results that are confusing or concerning to you. Please understand that not all results are received at the same time and often  the doctors may need to interpret multiple results in order to provide you with the best plan of care or course of treatment. Therefore, we ask that you please give us  2 business days to thoroughly review all your results before contacting the office for clarification. Should we see a critical lab result, you will be contacted sooner.   If You Need Anything After Your Visit  If you have any questions or concerns for your doctor, please call our main line at [phone] and press option 4 to reach your doctor's medical assistant. If no one answers, please leave a voicemail as directed and we will return your call as soon as possible. Messages left after 4 pm will be answered the following business day.   You may also send us  a message via MyChart. We typically respond to MyChart messages within 1-2 business days.  For prescription refills, please ask your pharmacy to contact our office. Our fax number is [phone].  If you have an urgent issue when the clinic is closed that cannot wait until the next business day, you can page your doctor at the number below.    Please note that while we do our best to be available for urgent issues outside of office hours, we are not available 24/7.   If you have an urgent issue and are unable to reach us , you may choose to seek medical care at your doctor's office, retail clinic, urgent care center, or emergency room.  If you have a medical emergency, please immediately call 911 or go to the emergency department.  Pager Numbers  - Dr. Hester: [phone]  - Dr. Jackquline: [phone]  - Dr. Claudene: [phone]   - Dr. Raymund: [phone]  In the event of inclement weather, please call our main line at [phone] for an update on the status of any delays or closures.  Dermatology Medication Tips: Please keep the boxes that topical medications come in in order to help keep track of the instructions about where and how to use these. Pharmacies  typically print the medication instructions only on the boxes and not directly on the medication tubes.   If your medication is too expensive, please contact our office at [phone] option 4 or send us  a message through MyChart.   We are unable to tell what your co-pay for medications will be in advance as this is different depending on your insurance coverage. However, we may be able to find a substitute medication at lower cost or fill out paperwork to get insurance to cover a needed medication.   If a prior authorization is required to get your medication covered by your insurance company, please allow us  1-2 business days to complete this process.  Drug prices often vary depending on where the prescription is filled and some pharmacies may offer cheaper prices.  The website www.goodrx.com contains coupons for medications through different  pharmacies. The prices here do not account for what the cost may be with help from insurance (it may be cheaper with your insurance), but the website can give you the price if you did not use any insurance.  - You can print the associated coupon and take it with your prescription to the pharmacy.  - You may also stop by our office during regular business hours and pick up a GoodRx coupon card.  - If you need your prescription sent electronically to a different pharmacy, notify our office through [facility] or by phone at [phone] option 4.     Si Usted Necesita Algo Despus de Su Visita  Tambin puede enviarnos un mensaje a travs de Clinical Cytogeneticist. Por lo general respondemos a los mensajes de MyChart en el transcurso de 1 a 2 das hbiles.  Para renovar recetas, por favor pida a su farmacia que se ponga en contacto con nuestra oficina. Randi lakes de fax es Center [phone].  Si tiene un asunto urgente cuando la clnica est cerrada y que no puede esperar hasta el siguiente da hbil, puede llamar/localizar a su doctor(a) al nmero que  aparece a continuacin.   Por favor, tenga en cuenta que aunque hacemos todo lo posible para estar disponibles para asuntos urgentes fuera del horario de [place], no estamos disponibles las 24 horas del da, los 7 [address]  [address] de la [place].   Si tiene un problema urgente y no puede comunicarse con nosotros, puede optar por buscar atencin mdica  en el consultorio de su doctor(a), en una clnica privada, en un centro de atencin urgente o en una sala de emergencias.  Si tiene engineer, drilling, por favor llame inmediatamente al 911 o vaya a la sala de emergencias.  Nmeros de bper  - Dr. Hester: [phone]  - Dra. Jackquline: [phone]  - Dr. Claudene: [phone]  - Dra. Kitts: [phone]  En caso de inclemencias del [place], por favor llame a nuestra lnea principal al [phone] para una actualizacin sobre el estado de cualquier retraso o cierre.  Consejos para la medicacin en dermatologa: Por favor, guarde las cajas en las que vienen los medicamentos de uso tpico para ayudarle a seguir las instrucciones sobre dnde y cmo usarlos. Las farmacias generalmente imprimen las instrucciones del medicamento slo en las cajas y no directamente en los tubos del [place].   Si su medicamento es muy caro, por favor, pngase en contacto con landry rieger llamando al [phone] y presione la opcin 4 o envenos un mensaje a travs de Clinical Cytogeneticist.   No podemos decirle cul ser su copago por los medicamentos por adelantado ya que esto es diferente dependiendo de la cobertura de su seguro. Sin embargo, es posible que podamos encontrar un medicamento sustituto a audiological scientist un formulario para que el seguro cubra el medicamento que se considera necesario.   Si se requiere una autorizacin previa para que su compaa de seguros cubra su medicamento, por favor permtanos de 1 a 2 das hbiles para completar este proceso.  Los precios de los medicamentos varan con frecuencia  dependiendo del environmental consultant de dnde se surte la receta y alguna farmacias pueden ofrecer precios ms baratos.  El sitio web www.goodrx.com tiene cupones para medicamentos de health and safety inspector. Los precios aqu no tienen en cuenta lo que podra costar con la ayuda del seguro (puede ser ms barato con su seguro), pero el sitio web puede darle el precio si no utiliz tourist information centre manager.  - Puede imprimir el cupn  correspondiente y llevarlo con su receta a la farmacia.  - Tambin puede pasar por nuestra oficina durante el horario de atencin regular y education officer, museum una tarjeta de cupones de GoodRx.  - Si necesita que su receta se enve electrnicamente a una farmacia diferente, informe a nuestra oficina a travs de MyChart de [place] o por telfono llamando al [phone] y presione la opcin 4.

## 2023-12-03 NOTE — Progress Notes (Signed)
 Follow-Up Visit   Subjective  Carl Chen is a 83 y.o. male who presents for the following: Skin Cancer Screening and Full Body Skin Exam, hx of BCC,SCC,SCC IS, Aks,   The patient presents for Total-Body Skin Exam (TBSE) for skin cancer screening and mole check. The patient has spots, moles and lesions to be evaluated, some may be new or changing and the patient may have concern these could be cancer.    The following portions of the chart were reviewed this encounter and updated as appropriate: medications, allergies, medical history  Review of Systems:  No other skin or systemic complaints except as noted in HPI or Assessment and Plan.  Objective  Well appearing patient in no apparent distress; mood and affect are within normal limits.  A full examination was performed including scalp, head, eyes, ears, nose, lips, neck, chest, axillae, abdomen, back, buttocks, bilateral upper extremities, bilateral lower extremities, hands, feet, fingers, toes, fingernails, and toenails. All findings within normal limits unless otherwise noted below.   Relevant physical exam findings are noted in the Assessment and Plan.  Nasal Dorsum 6 mm keratotic papule  Left nasal tip 6 mm keratotic papule  Right Mid Cheek 8 mm keratotic papule  face x 15, L dorsal hand x 1, L forearm x 1, neck x 3 (20) Erythematous thin papules/macules with gritty scale.   Assessment & Plan   SKIN CANCER SCREENING PERFORMED TODAY.  ACTINIC DAMAGE WITH PRECANCEROUS ACTINIC KERATOSES Counseling for Topical Chemotherapy Management: Patient exhibits: - Severe, confluent actinic changes with pre-cancerous actinic keratoses that is secondary to cumulative UV radiation exposure over time - Condition that is severe; chronic, not at goal. - diffuse scaly erythematous macules and papules with underlying dyspigmentation - Discussed Prescription Field Treatment topical Chemotherapy for Severe, Chronic Confluent  Actinic Changes with Pre-Cancerous Actinic Keratoses Field treatment involves treatment of an entire area of skin that has confluent Actinic Changes (Sun/ Ultraviolet light damage) and PreCancerous Actinic Keratoses by method of PhotoDynamic Therapy (PDT) and/or prescription Topical Chemotherapy agents such as 5-fluorouracil , 5-fluorouracil /calcipotriene, and/or imiquimod.  The purpose is to decrease the number of clinically evident and subclinical PreCancerous lesions to prevent progression to development of skin cancer by chemically destroying early precancer changes that may or may not be visible.  It has been shown to reduce the risk of developing skin cancer in the treated area. As a result of treatment, redness, scaling, crusting, and open sores may occur during treatment course. One or more than one of these methods may be used and may have to be used several times to control, suppress and eliminate the PreCancerous changes. Discussed treatment course, expected reaction, and possible side effects. - Recommend daily broad spectrum sunscreen SPF 30+ to sun-exposed areas, reapply every 2 hours as needed.  - Staying in the shade or wearing long sleeves, sun glasses (UVA+UVB protection) and wide brim hats (4-inch brim around the entire circumference of the hat) are also recommended. - Call for new or changing lesions. Patient deferred 5FU/Calcipotriene therapy. Prefers to Tx AKs with LN2.     LENTIGINES, SEBORRHEIC KERATOSES, HEMANGIOMAS - Benign normal skin lesions - Benign-appearing - Call for any changes  MELANOCYTIC NEVI - Tan-brown and/or pink-flesh-colored symmetric macules and papules - Benign appearing on exam today - Observation - Call clinic for new or changing moles - Recommend daily use of broad spectrum spf 30+ sunscreen to sun-exposed areas.   HISTORY OF BASAL CELL CARCINOMA OF THE SKIN - No evidence of recurrence  today - Recommend regular full body skin exams - Recommend daily  broad spectrum sunscreen SPF 30+ to sun-exposed areas, reapply every 2 hours as needed.  - Call if any new or changing lesions are noted between office visits  - mid forehead  HISTORY OF SQUAMOUS CELL CARCINOMA OF THE SKIN - No evidence of recurrence today - No lymphadenopathy - Recommend regular full body skin exams - Recommend daily broad spectrum sunscreen SPF 30+ to sun-exposed areas, reapply every 2 hours as needed.  - Call if any new or changing lesions are noted between office visits - R temple  HISTORY OF SQUAMOUS CELL CARCINOMA IN SITU OF THE SKIN - No evidence of recurrence today - Recommend regular full body skin exams - Recommend daily broad spectrum sunscreen SPF 30+ to sun-exposed areas, reapply every 2 hours as needed.  - Call if any new or changing lesions are noted between office visits  - L neck, L mid back    TINEA PEDIS, TINEA UNGUIUM Exam: Scaling and maceration web spaces and over distal and lateral soles.  Treatment Plan: Continue Ketoconazole  cream/Lamisil  cream as directed  NEOPLASM OF SKIN (3) Nasal Dorsum Skin / nail biopsy Type of biopsy: tangential   Informed consent: discussed and consent obtained   Timeout: patient name, date of birth, surgical site, and procedure verified   Procedure prep:  Patient was prepped and draped in usual sterile fashion Prep type:  Isopropyl alcohol Anesthesia: the lesion was anesthetized in a standard fashion   Anesthetic:  1% lidocaine w/ epinephrine 1-100,000 buffered w/ 8.4% NaHCO3 Instrument used: DermaBlade   Hemostasis achieved with: pressure and aluminum chloride   Outcome: patient tolerated procedure well   Post-procedure details: sterile dressing applied and wound care instructions given   Dressing type: bandage and petrolatum    Specimen 1 - Surgical pathology Differential Diagnosis: R/O SCC  Check Margins: No Left nasal tip Skin / nail biopsy Type of biopsy: tangential   Informed consent: discussed  and consent obtained   Timeout: patient name, date of birth, surgical site, and procedure verified   Procedure prep:  Patient was prepped and draped in usual sterile fashion Prep type:  Isopropyl alcohol Anesthesia: the lesion was anesthetized in a standard fashion   Anesthetic:  1% lidocaine w/ epinephrine 1-100,000 buffered w/ 8.4% NaHCO3 Instrument used: DermaBlade   Hemostasis achieved with: pressure and aluminum chloride   Outcome: patient tolerated procedure well   Post-procedure details: sterile dressing applied and wound care instructions given   Dressing type: bandage and petrolatum    Specimen 2 - Surgical pathology Differential Diagnosis: R/O SCC  Check Margins: No Right Mid Cheek Skin / nail biopsy Type of biopsy: tangential   Informed consent: discussed and consent obtained   Timeout: patient name, date of birth, surgical site, and procedure verified   Procedure prep:  Patient was prepped and draped in usual sterile fashion Prep type:  Isopropyl alcohol Anesthesia: the lesion was anesthetized in a standard fashion   Anesthetic:  1% lidocaine w/ epinephrine 1-100,000 buffered w/ 8.4% NaHCO3 Instrument used: DermaBlade   Hemostasis achieved with: pressure and aluminum chloride   Outcome: patient tolerated procedure well   Post-procedure details: sterile dressing applied and wound care instructions given   Dressing type: bandage and petrolatum    Specimen 3 - Surgical pathology Differential Diagnosis: R/O SCC  Check Margins: No AK (ACTINIC KERATOSIS) (20) face x 15, L dorsal hand x 1, L forearm x 1, neck x 3 (20) Actinic  keratoses are precancerous spots that appear secondary to cumulative UV radiation exposure/sun exposure over time. They are chronic with expected duration over 1 year. A portion of actinic keratoses will progress to squamous cell carcinoma of the skin. It is not possible to reliably predict which spots will progress to skin cancer and so treatment is  recommended to prevent development of skin cancer.  Recommend daily broad spectrum sunscreen SPF 30+ to sun-exposed areas, reapply every 2 hours as needed.  Recommend staying in the shade or wearing long sleeves, sun glasses (UVA+UVB protection) and wide brim hats (4-inch brim around the entire circumference of the hat). Call for new or changing lesions. Destruction of lesion - face x 15, L dorsal hand x 1, L forearm x 1, neck x 3 (20) Complexity: simple   Destruction method: cryotherapy   Informed consent: discussed and consent obtained   Timeout:  patient name, date of birth, surgical site, and procedure verified Lesion destroyed using liquid nitrogen: Yes   Region frozen until ice ball extended beyond lesion: Yes   Cryo cycles: 1 or 2. Outcome: patient tolerated procedure well with no complications   Post-procedure details: wound care instructions given    MULTIPLE BENIGN NEVI   ACTINIC ELASTOSIS   SEBORRHEIC KERATOSES   LENTIGINES   CHERRY ANGIOMA   SOLAR PURPURA   EIC (EPIDERMAL INCLUSION CYST)   TINEA PEDIS OF BOTH FEET   ONYCHOMYCOSIS    Return in about 6 months (around 06/01/2024) for TBSE, HxSCCis, HxSCC, HxBCC, HxAKs.  I, Jill Parcell, CMA, am acting as scribe for Boneta Sharps, MD.    Documentation: I have reviewed the above documentation for accuracy and completeness, and I agree with the above.  Boneta Sharps, MD

## 2023-12-07 LAB — SURGICAL PATHOLOGY

## 2023-12-08 ENCOUNTER — Ambulatory Visit: Payer: Self-pay | Admitting: Dermatology

## 2023-12-08 ENCOUNTER — Encounter: Payer: Self-pay | Admitting: Dermatology

## 2023-12-08 DIAGNOSIS — C4492 Squamous cell carcinoma of skin, unspecified: Secondary | ICD-10-CM

## 2023-12-08 NOTE — Telephone Encounter (Signed)
-----   Message from [facility] sent at 12/08/2023  8:35 AM EST ----- Diagnosis: 1. Skin, nasal dorsum :       WELL DIFFERENTIATED SQUAMOUS CELL CARCINOMA, ACANTHOLYTIC (ADENOID) VARIANT        2. Skin, left nasal tip :       WELL DIFFERENTIATED SQUAMOUS CELL CARCINOMA        3. Skin, right mid cheek :       SQUAMOUS CELL CARCINOMA IN SITU   Please call with diagnosis and refer to Mohs surgery.  Nasal dorsum, invasive SCC, Mohs L nasal tip, invasive SCC, Mohs R cheek, SCCis, 5FU/calcipotriene BID up to 7 days or until reaction vs Mohs ----- Message ----- From: Interface, [facility] Sent: 12/07/2023   4:37 PM EST To: Boneta Sharps, MD

## 2023-12-08 NOTE — Telephone Encounter (Signed)
 Called patient and reviewed pathology results. Patient prefers Mohs referral be sent for all three locations. Referral sent to [facility], Dr. Gregorio. Lonell RAMAN., RMA

## 2023-12-28 ENCOUNTER — Other Ambulatory Visit: Payer: Self-pay

## 2023-12-28 DIAGNOSIS — C4492 Squamous cell carcinoma of skin, unspecified: Secondary | ICD-10-CM

## 2023-12-30 ENCOUNTER — Ambulatory Visit: Admitting: Physician Assistant

## 2023-12-30 VITALS — BP 151/86 | HR 82 | Ht 71.0 in | Wt 159.0 lb

## 2023-12-30 DIAGNOSIS — N401 Enlarged prostate with lower urinary tract symptoms: Secondary | ICD-10-CM | POA: Diagnosis not present

## 2023-12-30 DIAGNOSIS — R3914 Feeling of incomplete bladder emptying: Secondary | ICD-10-CM | POA: Diagnosis not present

## 2023-12-30 DIAGNOSIS — N481 Balanitis: Secondary | ICD-10-CM | POA: Diagnosis not present

## 2023-12-30 DIAGNOSIS — R310 Gross hematuria: Secondary | ICD-10-CM

## 2023-12-30 LAB — URINALYSIS, COMPLETE
Bilirubin, UA: NEGATIVE
Ketones, UA: NEGATIVE
Leukocytes,UA: NEGATIVE
Nitrite, UA: NEGATIVE
Protein,UA: NEGATIVE
Specific Gravity, UA: 1.015 (ref 1.005–1.030)
Urobilinogen, Ur: 0.2 mg/dL (ref 0.2–1.0)
pH, UA: 6 (ref 5.0–7.5)

## 2023-12-30 LAB — MICROSCOPIC EXAMINATION: Bacteria, UA: NONE SEEN

## 2023-12-30 LAB — BLADDER SCAN AMB NON-IMAGING

## 2023-12-30 MED ORDER — CLOTRIMAZOLE-BETAMETHASONE 1-0.05 % EX CREA: 1.0000 | TOPICAL_CREAM | Freq: Two times a day (BID) | CUTANEOUS | 3 refills | Status: AC

## 2023-12-30 MED ORDER — FINASTERIDE 5 MG PO TABS: 5.0000 mg | ORAL_TABLET | Freq: Every day | ORAL | 11 refills | Status: AC

## 2023-12-30 NOTE — Progress Notes (Signed)
 12/30/2023 2:42 PM   Carl Chen Jan 15, 1941 [id number]  CC: Chief Complaint  Patient presents with   Other   HPI: Carl Chen is a 83 y.o. 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   Today he reports balanitis, which has been treated by his PCP with topical clotrimazole/betamethasone cream.  His PCP retired, so he is requesting that we fill this prescription.  He reports that he frequently has to have a bowel movement when he is urinating.  He denies loose stools.  He is not on any pharmacotherapy for BPH, and has been struggling with some daytime dizziness.  No recent gross hematuria episodes.  At his age, he would like to avoid further prostate procedures if at all possible.  In-office UA today positive for 3+ glucose and trace intact blood; urine microscopy pan negative. PVR , previously 205 mL.  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 If you have any questions, ask your nurse or doctor.          acetaminophen 500 MG tablet Commonly known as: TYLENOL Take 1,000 mg by mouth every 8 (eight) hours as needed.   dapagliflozin propanediol 10 MG Tabs tablet Commonly known as: FARXIGA Take 10 mg by mouth daily.   Entresto 49-51 MG Generic drug: sacubitril-valsartan Take 1 tablet by mouth 2 (two) times daily.   eplerenone 25 MG tablet Commonly known as: INSPRA Take 1 tablet by mouth daily.   glipiZIDE 5 MG 24 hr tablet Commonly known as: GLUCOTROL XL Take 10 mg by mouth daily with breakfast.   HumuLIN 70/30 KwikPen (70-30) 100 UNIT/ML KwikPen Generic drug: insulin isophane & regular human KwikPen Inject into the skin.  Inject 10-12 Units subcutaneously 2 (two) times daily before meals   metFORMIN 1000 MG tablet Commonly known as: GLUCOPHAGE Take 1,000 mg by mouth 2 (two) times daily with a meal.   methocarbamol 500 MG tablet Commonly known as: ROBAXIN Take 500 mg by mouth 3 (three) times daily.   metoprolol succinate 25 MG 24 hr tablet Commonly known  as: TOPROL-XL Take 25 mg by mouth daily.   simvastatin 20 MG tablet Commonly known as: ZOCOR Take 20 mg by mouth daily.   terbinafine  1 % cream Commonly known as: LamISIL  AT Apply 1 Application topically 2 (two) times daily. Bid to left ankle   vitamin B-12 500 MCG tablet Commonly known as: CYANOCOBALAMIN Take 500 mcg by mouth daily.        Allergies:  Allergies  Allergen Reactions   Empagliflozin Other (See Comments)    Weight loss  empagliflozin   Penicillins Other (See Comments)    stomach  GI upset  Other reaction(s): Other (See Comments)  stomach  GI upset    Family History: Family History  Problem Relation Age  of Onset   Arthritis Other        RA    Social History:   reports that he has quit smoking. His smoking use included cigarettes. He has a 180 pack-year smoking history. His smokeless tobacco use includes chew. He reports that he does not drink alcohol. No history on file for drug use.  Physical Exam: BP (!) 151/86   Pulse 82   Ht 5' 11 (1.803 m)   Wt 159 lb (72.1 kg)   SpO2 98%   BMI 22.18 kg/m   Constitutional:  Alert and oriented, no acute distress, nontoxic appearing HEENT: [place], AT Cardiovascular: No clubbing, cyanosis, or edema Respiratory: Normal respiratory effort, no increased work of breathing Skin: No rashes, bruises or suspicious lesions Neurologic: Grossly intact, no focal deficits, moving all 4 extremities Psychiatric: Normal mood and affect  Laboratory Data: Results for orders placed or performed in visit on 12/30/23  BLADDER SCAN AMB NON-IMAGING   Collection Time: 12/30/23  2:41 PM  Result Value Ref Range   Scan Result   Microscopic Examination   Collection Time: 12/30/23  2:57 PM   Urine  Result Value Ref Range   WBC, UA 0-5 0 - 5 /hpf   RBC, Urine 0-2 0 - 2 /hpf   Epithelial Cells (non renal) 0-10 0 - 10 /hpf   Bacteria, UA None seen None seen/Few  Urinalysis, Complete   Collection Time: 12/30/23  2:57 PM  Result Value Ref Range   Specific Gravity, UA 1.015 1.005 - 1.030   pH, UA 6.0 5.0 - 7.5   Color, UA Yellow Yellow   Appearance Ur Clear Clear   Leukocytes,UA Negative Negative   Protein,UA Negative Negative/Trace   Glucose, UA 3+ (A) Negative   Ketones, UA Negative Negative   RBC, UA Trace (A) Negative   Bilirubin, UA Negative Negative   Urobilinogen, Ur 0.2 0.2 - 1.0 mg/dL   Nitrite, UA Negative Negative   Microscopic Examination See below:    Assessment & Plan:   1. Balanitis (Primary) High risk for candidal balanitis due to Farxiga use with significant glucosuria on UA today.  Will continue topical Lotrisone as needed. -  clotrimazole-betamethasone (LOTRISONE) cream; Apply 1 Application topically 2 (two) times daily.  Dispense: 45 g; Refill: 3  2. Benign prostatic hyperplasia with incomplete bladder emptying PVR is elevated over prior, though not an overt retention.  I recommended starting finasteride to maximize his emptying and reduce his risk for further bleeding episodes in the future.  We discussed common side effects of ED and gynecomastia.  He is in agreement. - Urinalysis, Complete - BLADDER SCAN AMB NON-IMAGING - finasteride (PROSCAR) 5 MG tablet; Take 1 tablet (5 mg total) by mouth daily.  Dispense: 30 tablet; Refill:  11   Return in about 6 months (around 06/29/2024) for Follow-up.  Lucie Hones, PA-C  [facility] [place] [address], [address] [place], [place] [address] [phone]

## 2024-01-22 ENCOUNTER — Ambulatory Visit: Payer: Self-pay | Admitting: Physician Assistant

## 2024-02-02 ENCOUNTER — Encounter: Admitting: Dermatology

## 2024-02-10 ENCOUNTER — Encounter: Payer: Self-pay | Admitting: Dermatology

## 2024-02-16 ENCOUNTER — Ambulatory Visit: Admitting: Dermatology

## 2024-02-16 ENCOUNTER — Encounter: Admitting: Dermatology

## 2024-02-16 NOTE — Progress Notes (Deleted)
" ° °  Follow-Up Visit   Subjective  Carl Chen is a 84 y.o. male who presents for the following: Mohs of left nasal tip  The following portions of the chart were reviewed this encounter and updated as appropriate: medications, allergies, medical history  Review of Systems:  No other skin or systemic complaints except as noted in HPI or Assessment and Plan.  Objective  Well appearing patient in no apparent distress; mood and affect are within normal limits.  A focused examination was performed of the following areas: Left nasal tip Relevant physical exam findings are noted in the Assessment and Plan.     Assessment & Plan      Return in about 4 weeks (around 03/15/2024) for wound check.  I, Darice Smock, CMA, am acting as scribe for RUFUS CHRISTELLA HOLY, MD.   Documentation: I have reviewed the above documentation for accuracy and completeness, and I agree with the above.  RUFUS CHRISTELLA HOLY, MD  "

## 2024-02-16 NOTE — Patient Instructions (Addendum)
 Carl Chen

## 2024-02-25 ENCOUNTER — Encounter: Payer: Self-pay | Admitting: Dermatology

## 2024-03-01 ENCOUNTER — Encounter: Admitting: Dermatology

## 2024-04-28 ENCOUNTER — Ambulatory Visit: Admitting: Dermatology

## 2024-06-29 ENCOUNTER — Ambulatory Visit: Admitting: Physician Assistant
